# Patient Record
Sex: Female | Born: 1990 | Race: Black or African American | Hispanic: No | Marital: Single | Smoking: Current every day smoker
Health system: Southern US, Community
[De-identification: ages and names within clinical notes are randomized; demographics above are authoritative.]

## PROBLEM LIST (undated history)

## (undated) DIAGNOSIS — J45909 Unspecified asthma, uncomplicated: Secondary | ICD-10-CM

## (undated) DIAGNOSIS — I10 Essential (primary) hypertension: Secondary | ICD-10-CM

## (undated) DIAGNOSIS — L0231 Cutaneous abscess of buttock: Secondary | ICD-10-CM

## (undated) HISTORY — PX: NO PAST SURGERIES: SHX2092

## (undated) HISTORY — DX: Cutaneous abscess of buttock: L02.31

---

## 2008-06-13 ENCOUNTER — Emergency Department: Payer: Self-pay

## 2008-06-19 ENCOUNTER — Emergency Department: Payer: Self-pay | Admitting: Unknown Physician Specialty

## 2009-04-03 ENCOUNTER — Emergency Department: Payer: Self-pay | Admitting: Internal Medicine

## 2009-08-01 ENCOUNTER — Observation Stay: Payer: Self-pay

## 2009-08-07 ENCOUNTER — Inpatient Hospital Stay: Payer: Self-pay | Admitting: Obstetrics & Gynecology

## 2010-04-25 ENCOUNTER — Ambulatory Visit: Payer: Self-pay | Admitting: Internal Medicine

## 2011-09-30 LAB — URINALYSIS, COMPLETE
Bilirubin,UR: NEGATIVE
Blood: NEGATIVE
Glucose,UR: NEGATIVE mg/dL
Ketone: NEGATIVE
Leukocyte Esterase: NEGATIVE
Nitrite: NEGATIVE
Ph: 6
Protein: NEGATIVE
RBC,UR: 2 /HPF
Specific Gravity: 1.028
Squamous Epithelial: 5
WBC UR: 1 /HPF

## 2011-09-30 LAB — COMPREHENSIVE METABOLIC PANEL
Albumin: 3.7 g/dL (ref 3.4–5.0)
Alkaline Phosphatase: 48 U/L — ABNORMAL LOW (ref 50–136)
Anion Gap: 12 (ref 7–16)
BUN: 12 mg/dL (ref 7–18)
Bilirubin,Total: 0.2 mg/dL (ref 0.2–1.0)
Calcium, Total: 8.5 mg/dL (ref 8.5–10.1)
Chloride: 107 mmol/L (ref 98–107)
Co2: 24 mmol/L (ref 21–32)
Creatinine: 0.59 mg/dL — ABNORMAL LOW (ref 0.60–1.30)
EGFR (African American): >60
EGFR (Non-African Amer.): >60
Glucose: 134 mg/dL — ABNORMAL HIGH (ref 65–99)
Osmolality: 287 (ref 275–301)
Potassium: 4 mmol/L (ref 3.5–5.1)
SGOT(AST): 23 U/L (ref 15–37)
SGPT (ALT): 24 U/L
Sodium: 143 mmol/L (ref 136–145)
Total Protein: 7.5 g/dL (ref 6.4–8.2)

## 2011-09-30 LAB — CBC
HCT: 39.3 % (ref 35.0–47.0)
HGB: 13.1 g/dL (ref 12.0–16.0)
MCH: 31 pg (ref 26.0–34.0)
MCHC: 33.3 g/dL (ref 32.0–36.0)
MCV: 93 fL (ref 80–100)
Platelet: 280 10*3/uL (ref 150–440)
RBC: 4.22 10*6/uL (ref 3.80–5.20)
RDW: 14.4 % (ref 11.5–14.5)
WBC: 13.6 10*3/uL — ABNORMAL HIGH (ref 3.6–11.0)

## 2011-09-30 LAB — PREGNANCY, URINE: Pregnancy Test, Urine: NEGATIVE m[IU]/mL

## 2011-10-01 ENCOUNTER — Inpatient Hospital Stay: Payer: Self-pay | Admitting: Surgery

## 2011-10-01 LAB — CBC WITH DIFFERENTIAL/PLATELET
Basophil #: 0 10*3/uL (ref 0.0–0.1)
Basophil %: 0.2 %
Eosinophil %: 0.6 %
HCT: 28.1 % — ABNORMAL LOW (ref 35.0–47.0)
HGB: 9.8 g/dL — ABNORMAL LOW (ref 12.0–16.0)
Lymphocyte %: 14 %
MCHC: 34.7 g/dL (ref 32.0–36.0)
MCV: 93 fL (ref 80–100)
Monocyte %: 5.3 %
Neutrophil %: 79.9 %
RBC: 3.04 10*6/uL — ABNORMAL LOW (ref 3.80–5.20)
RDW: 13.6 % (ref 11.5–14.5)

## 2011-10-01 LAB — HEMATOCRIT: HCT: 30.9 % — ABNORMAL LOW (ref 35.0–47.0)

## 2011-10-01 LAB — HEMOGLOBIN: HGB: 10.4 g/dL — ABNORMAL LOW (ref 12.0–16.0)

## 2011-10-02 LAB — CBC WITH DIFFERENTIAL/PLATELET
Basophil #: 0 10*3/uL (ref 0.0–0.1)
Basophil %: 0.3 %
Eosinophil #: 0.2 10*3/uL (ref 0.0–0.7)
Eosinophil %: 2.1 %
HCT: 27.1 % — ABNORMAL LOW (ref 35.0–47.0)
HGB: 9 g/dL — ABNORMAL LOW (ref 12.0–16.0)
Lymphocyte %: 24.9 %
MCH: 30.7 pg (ref 26.0–34.0)
MCHC: 33.1 g/dL (ref 32.0–36.0)
Monocyte #: 0.7 10*3/uL (ref 0.0–0.7)
Neutrophil #: 6.2 10*3/uL (ref 1.4–6.5)
Neutrophil %: 65.4 %
RDW: 14.4 % (ref 11.5–14.5)

## 2012-11-10 ENCOUNTER — Encounter: Payer: Self-pay | Admitting: Obstetrics & Gynecology

## 2012-11-22 ENCOUNTER — Encounter: Payer: Self-pay | Admitting: Pediatrics

## 2012-11-22 DIAGNOSIS — I10 Essential (primary) hypertension: Secondary | ICD-10-CM | POA: Insufficient documentation

## 2012-12-08 ENCOUNTER — Encounter: Payer: Self-pay | Admitting: Maternal & Fetal Medicine

## 2012-12-29 ENCOUNTER — Encounter: Payer: Self-pay | Admitting: Maternal and Fetal Medicine

## 2013-01-09 DIAGNOSIS — J45909 Unspecified asthma, uncomplicated: Secondary | ICD-10-CM | POA: Insufficient documentation

## 2013-01-09 DIAGNOSIS — Z98891 History of uterine scar from previous surgery: Secondary | ICD-10-CM | POA: Insufficient documentation

## 2013-01-17 DIAGNOSIS — O411290 Chorioamnionitis, unspecified trimester, not applicable or unspecified: Secondary | ICD-10-CM | POA: Insufficient documentation

## 2013-02-09 ENCOUNTER — Emergency Department: Payer: Self-pay | Admitting: Emergency Medicine

## 2013-08-27 ENCOUNTER — Emergency Department: Payer: Self-pay | Admitting: Emergency Medicine

## 2013-09-12 ENCOUNTER — Emergency Department: Payer: Self-pay | Admitting: Emergency Medicine

## 2014-01-02 ENCOUNTER — Emergency Department: Payer: Self-pay | Admitting: Emergency Medicine

## 2014-01-25 ENCOUNTER — Emergency Department: Payer: Self-pay | Admitting: Emergency Medicine

## 2014-02-26 ENCOUNTER — Emergency Department: Payer: Self-pay | Admitting: Emergency Medicine

## 2014-10-15 IMAGING — US US OB DETAIL+14 WK - NRPT [facility]
1 series · 14 of 28 positions shown · non-contrast
Comparison: none

[Series 1: us ob detail+14 wk - nrpt [facility] · 0.25mm/px · 14 of 87 slices shown]
[im 4/87]
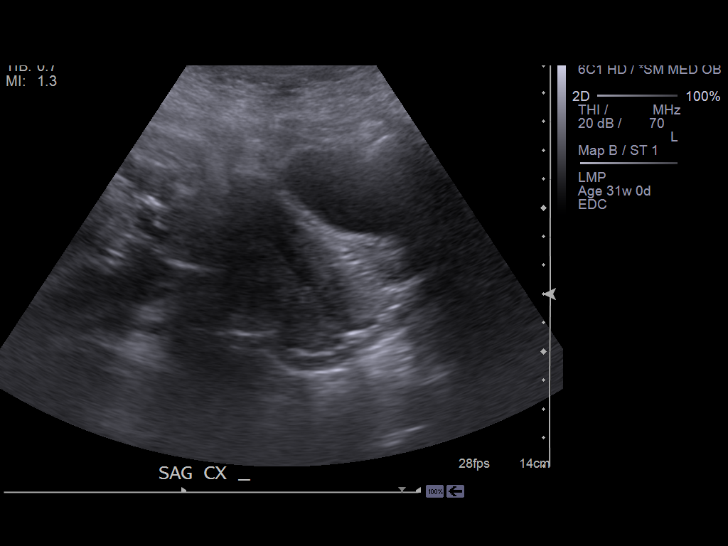
[im 10/87]
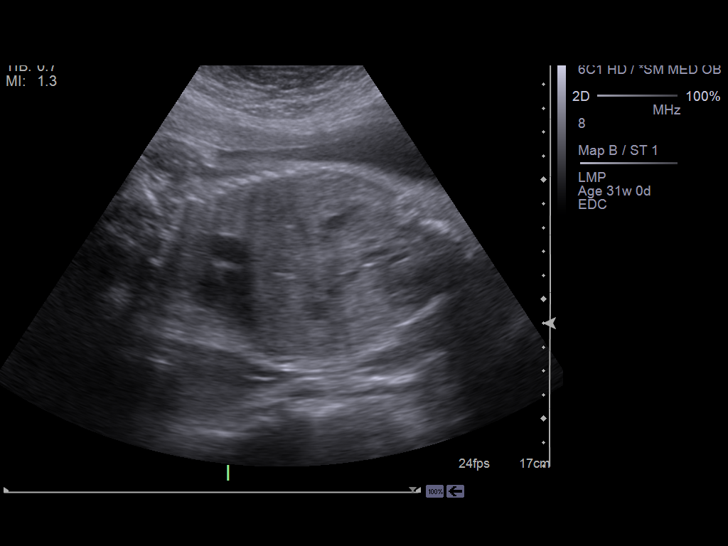
[im 16/87]
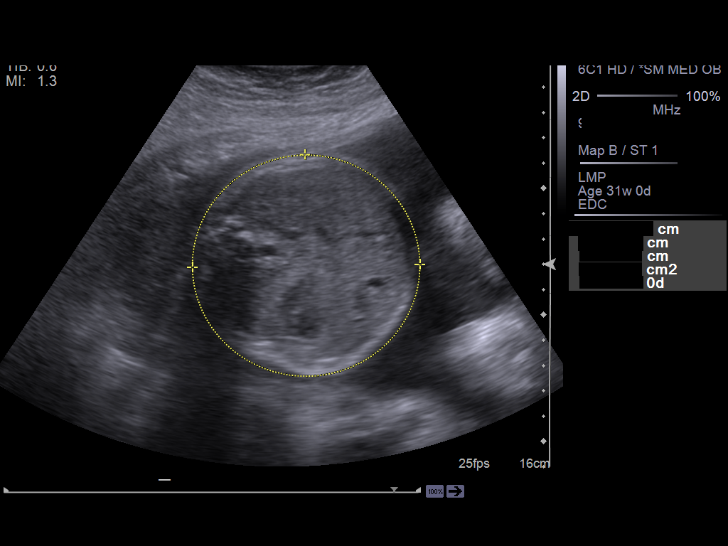
[im 23/87]
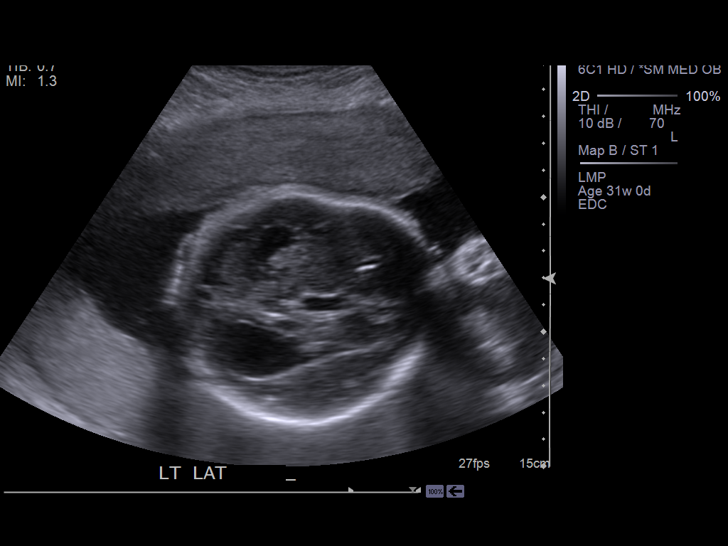
[im 29/87]
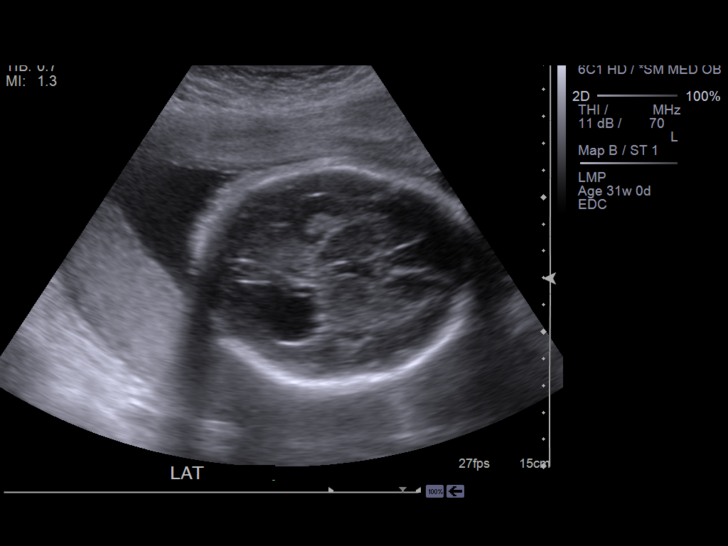
[im 36/87]
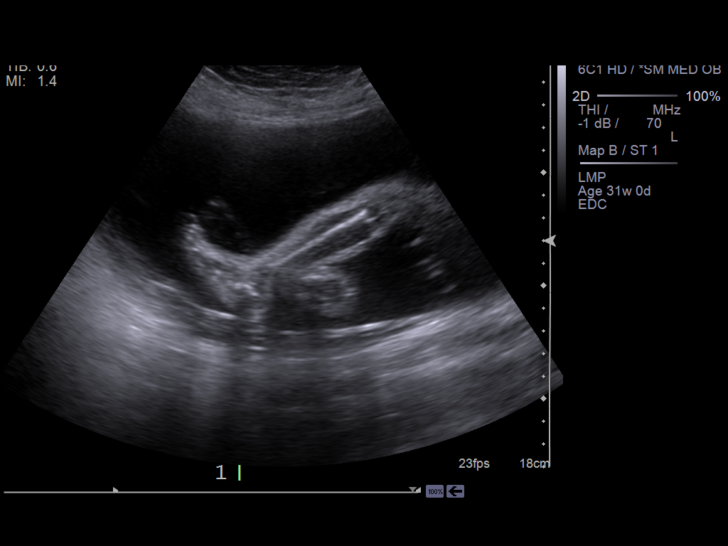
[im 42/87]
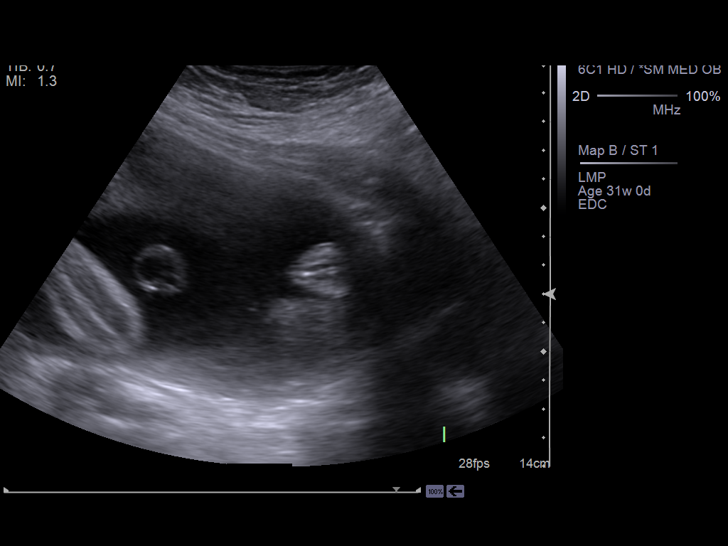
[im 48/87]
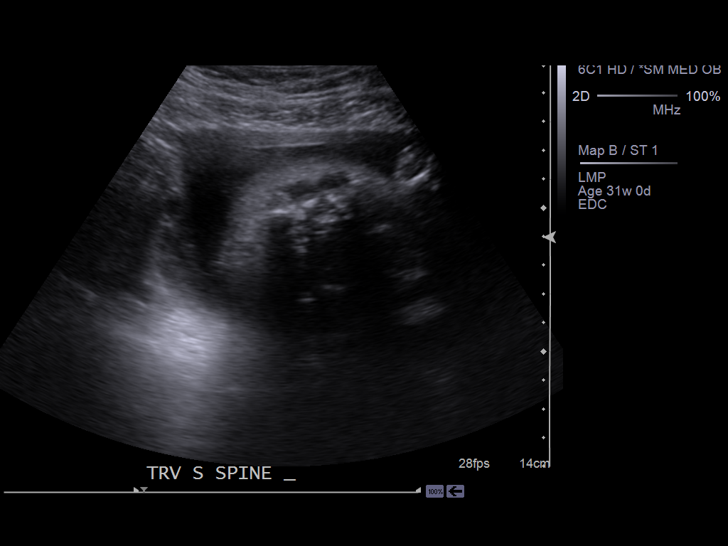
[im 55/87]
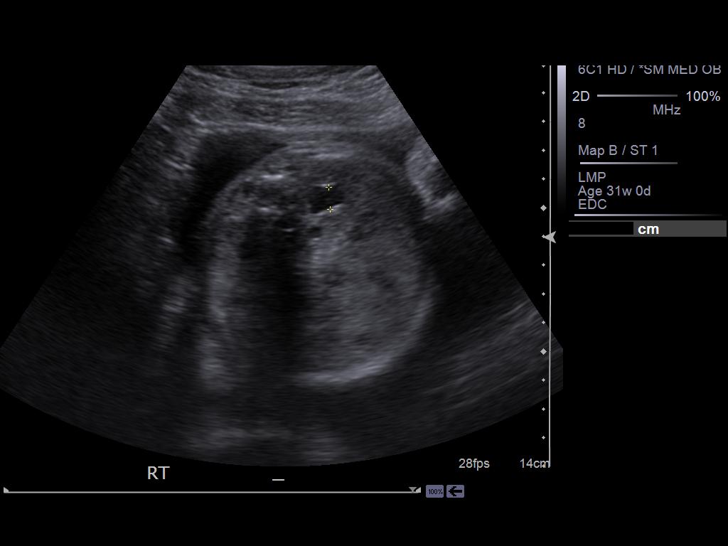
[im 61/87]
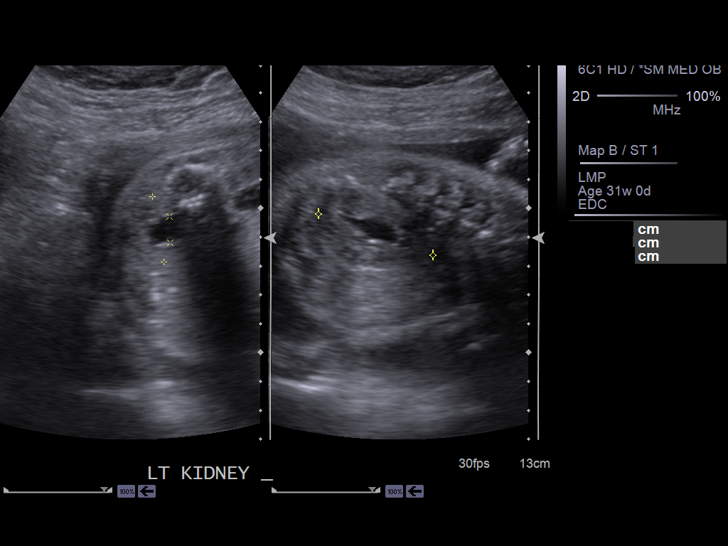
[im 67/87]
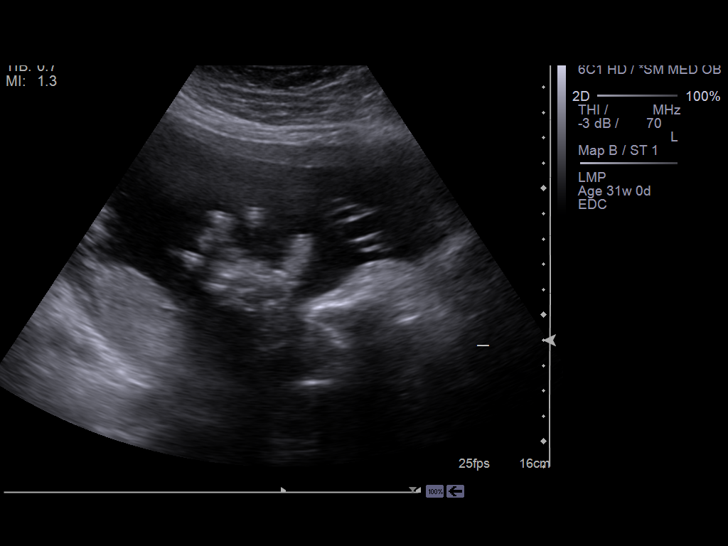
[im 74/87]
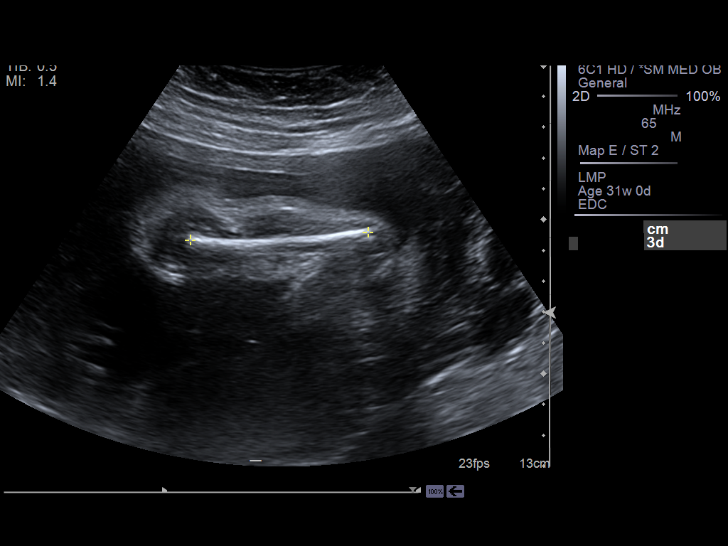
[im 80/87]
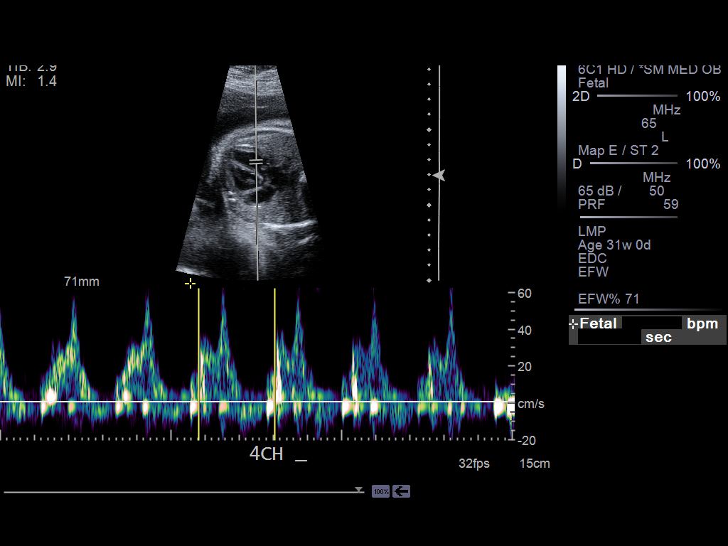
[im 87/87]
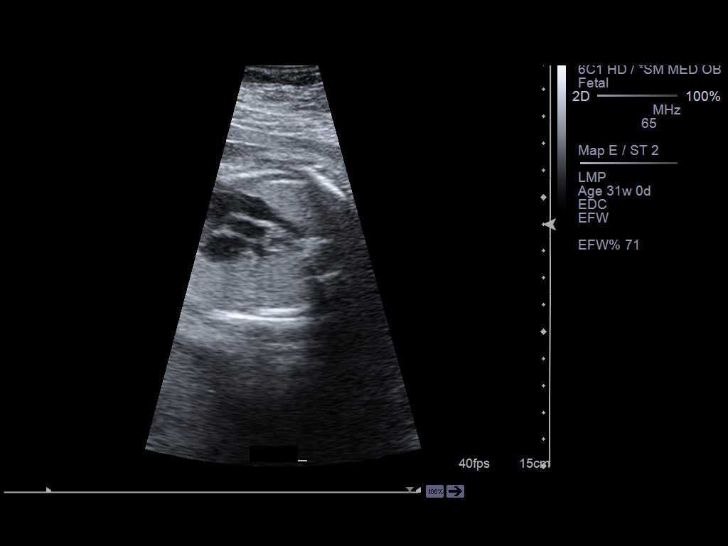

[14 of 28 positions shown; findings below may reference images not displayed]

IMAGES IMPORTED FROM THE [employer] WORKFLOW SYSTEM
NO DICTATION FOR STUDY

## 2014-11-12 IMAGING — US US OB FOLLOW-UP - [facility]PT [facility]
1 series · 14 of 28 positions shown · non-contrast
Comparison: none

[Series 1: us ob follow-up - nrpt [facility] · 0.33mm/px · 14 of 64 slices shown]
[im 3/64]
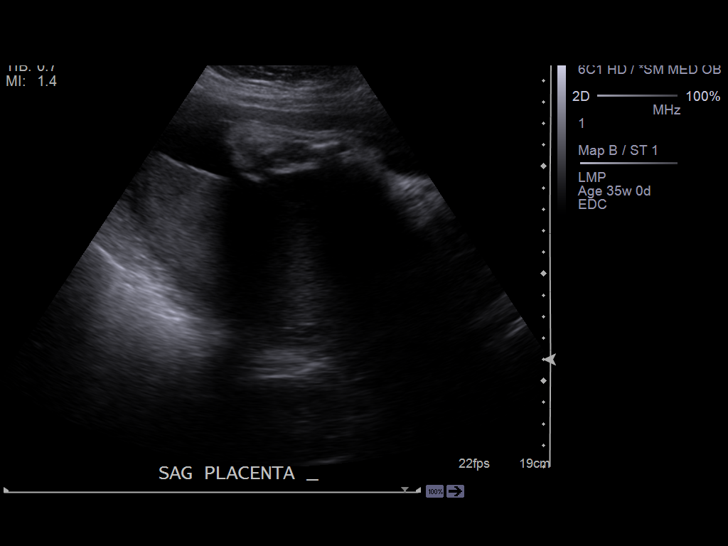
[im 8/64]
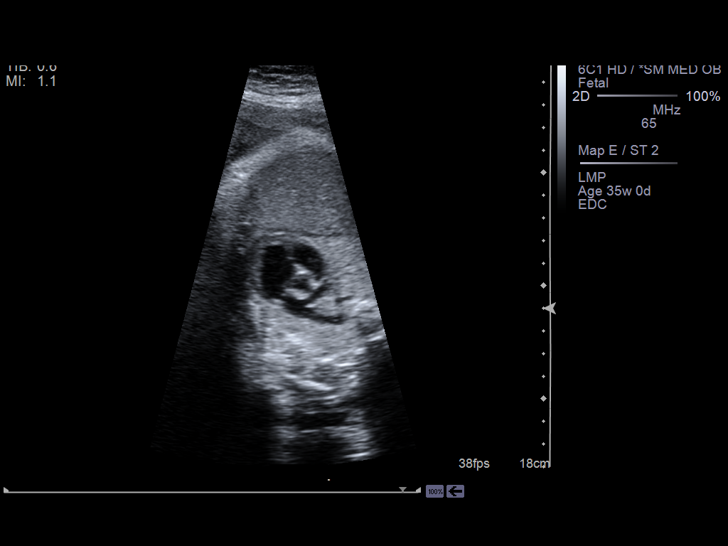
[im 12/64]
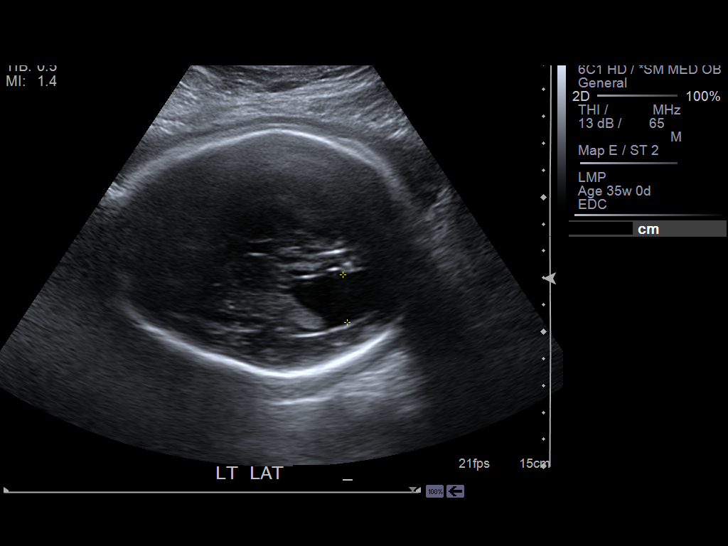
[im 17/64]
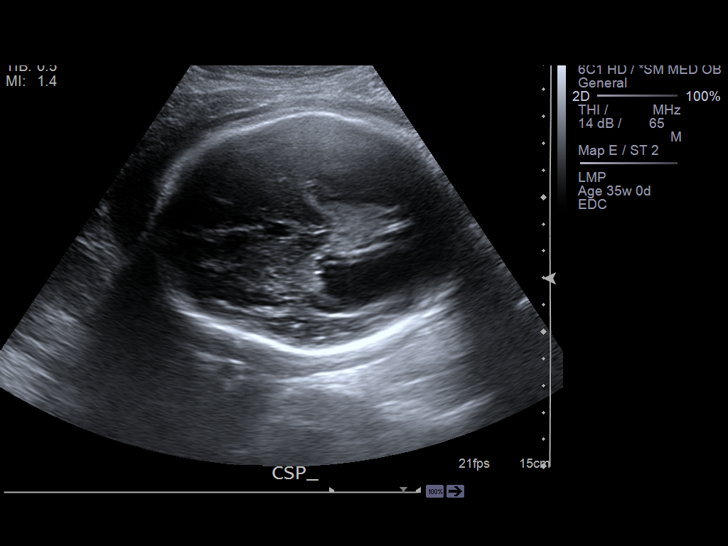
[im 22/64]
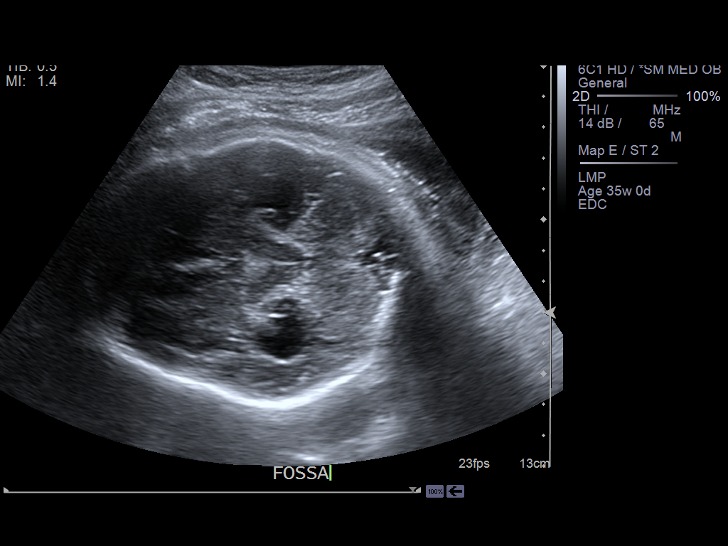
[im 26/64]
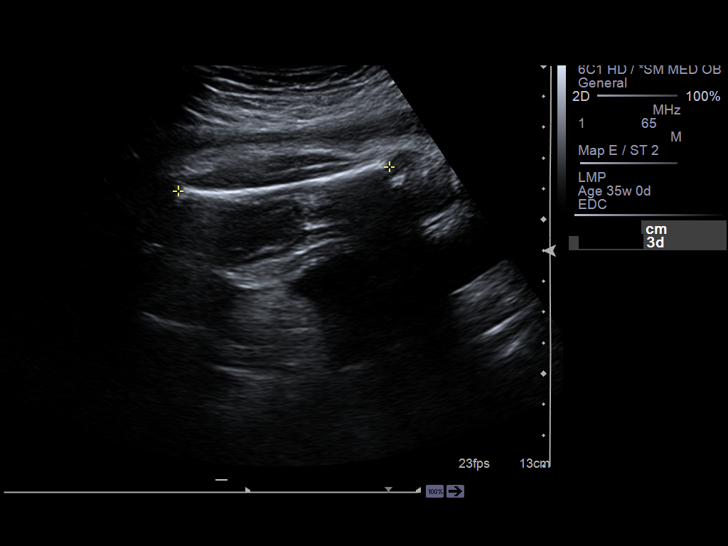
[im 31/64]
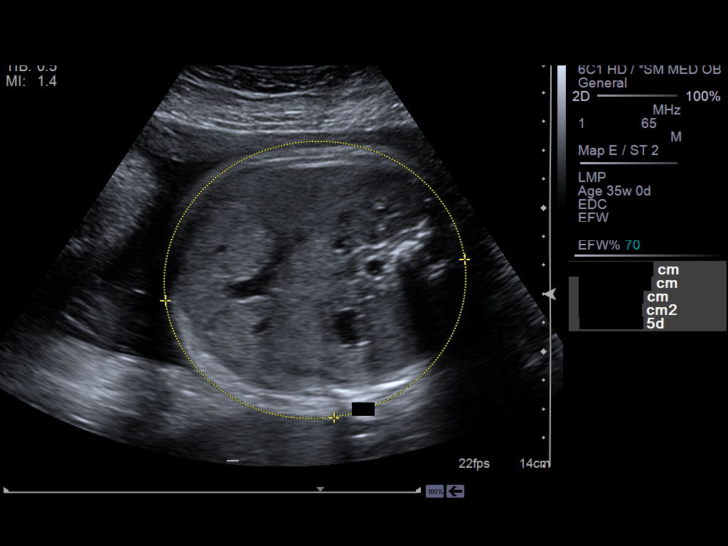
[im 36/64]
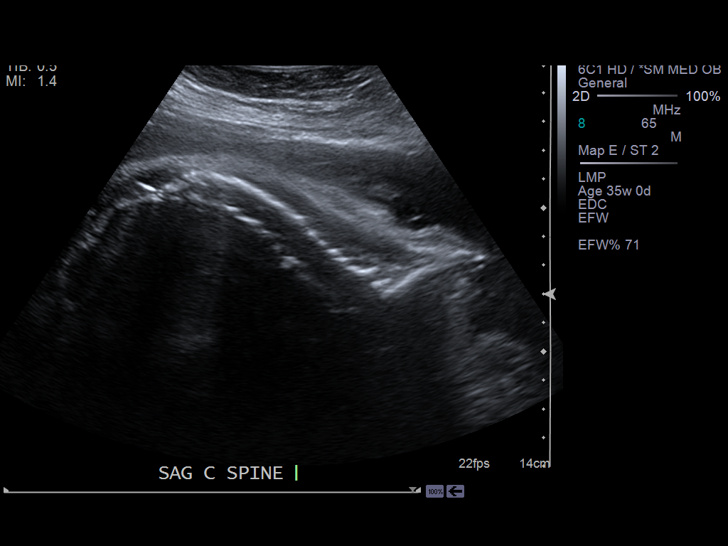
[im 40/64]
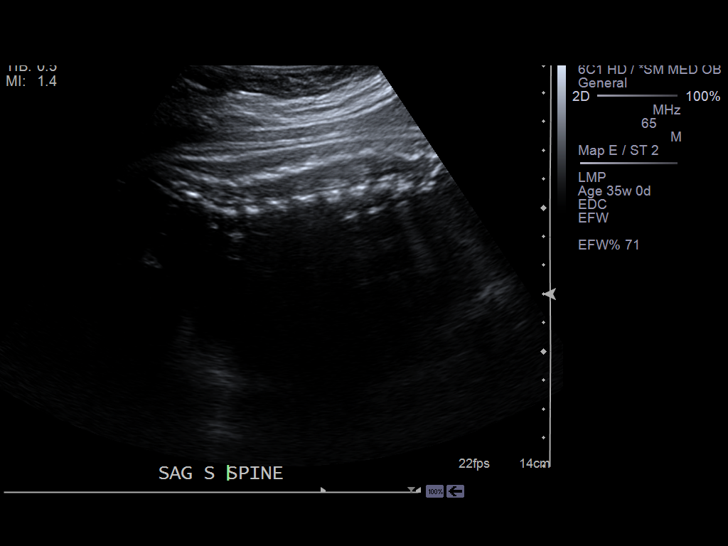
[im 45/64]
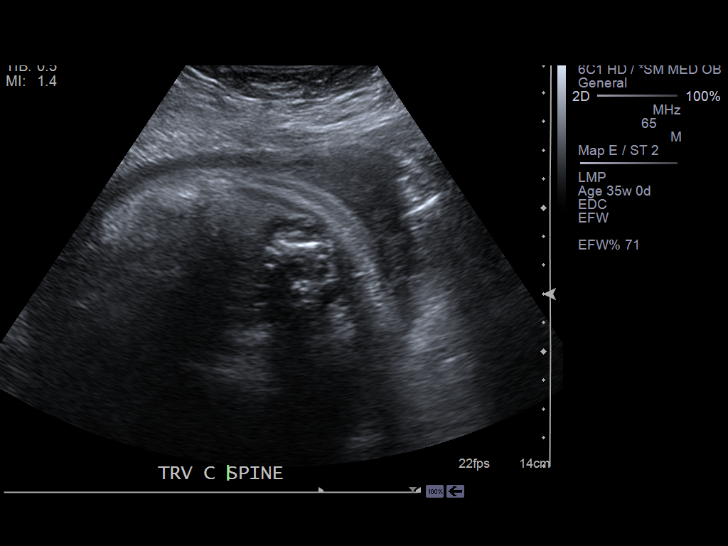
[im 50/64]
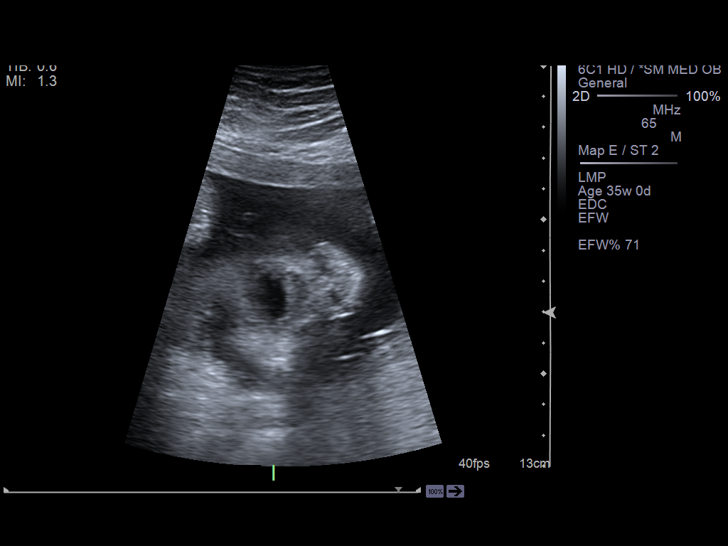
[im 54/64]
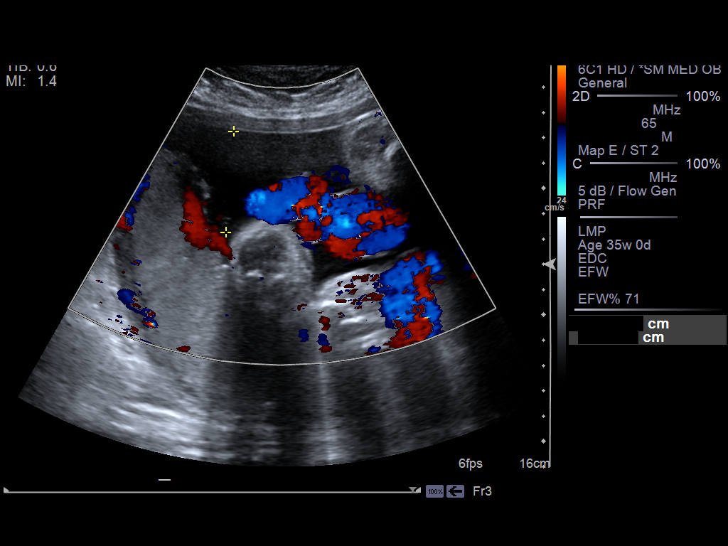
[im 59/64]
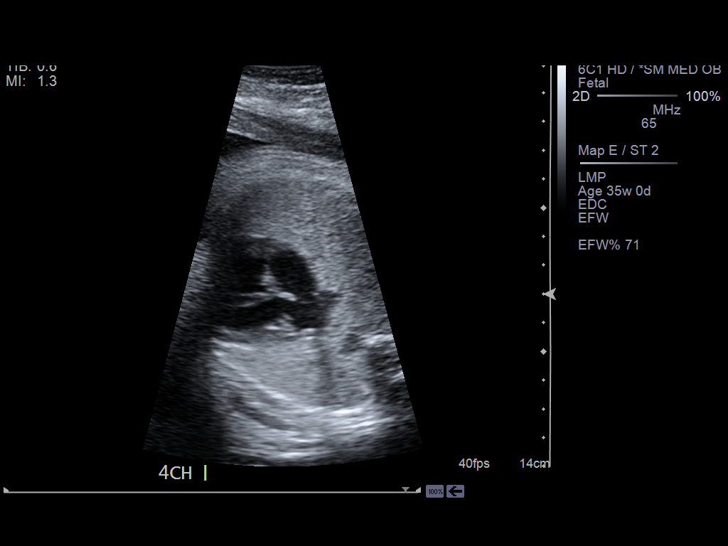
[im 64/64]
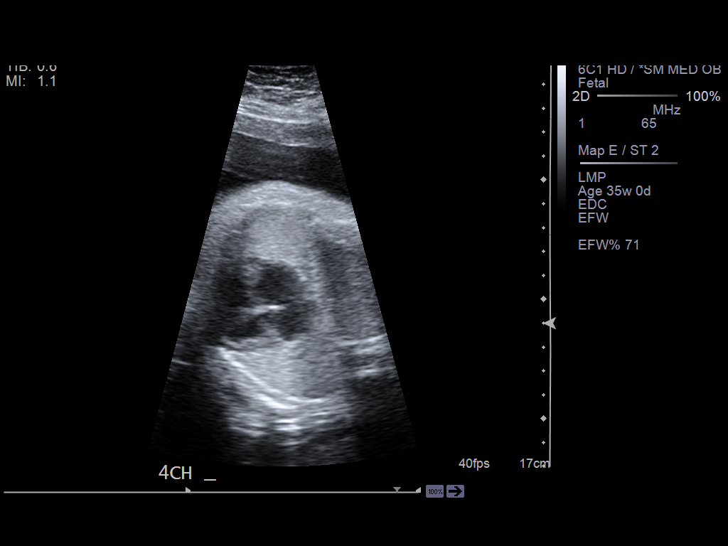

[14 of 28 positions shown; findings below may reference images not displayed]

IMAGES IMPORTED FROM THE [employer] WORKFLOW SYSTEM
NO DICTATION FOR STUDY

## 2014-12-03 IMAGING — US US OB FOLLOW-UP - [facility]PT [facility]
1 series · 14 of 28 positions shown · non-contrast
Comparison: none

[Series 1: us ob follow-up - [facility] · 0.26mm/px · 14 of 86 slices shown]
[im 4/86]
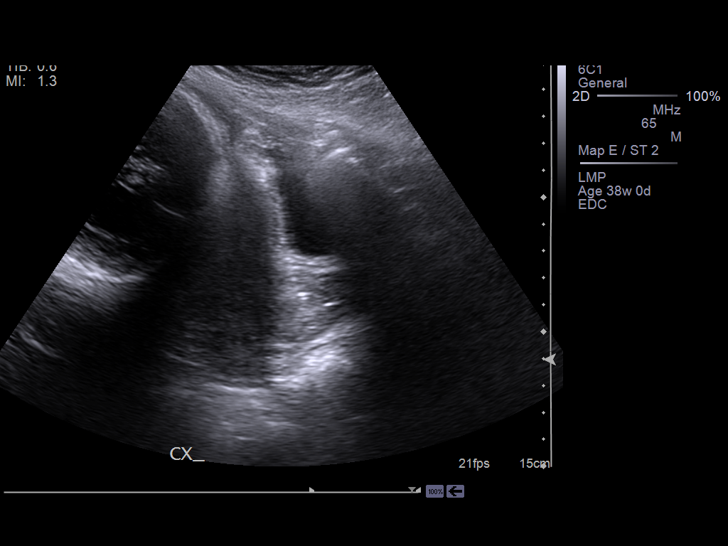
[im 10/86]
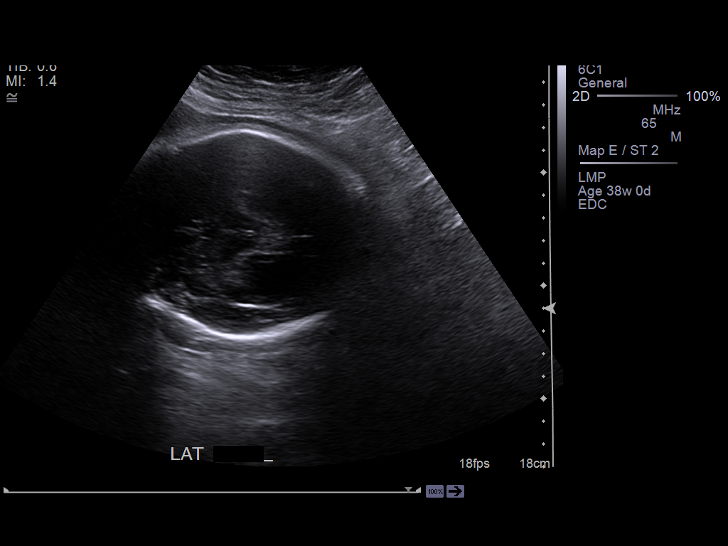
[im 16/86]
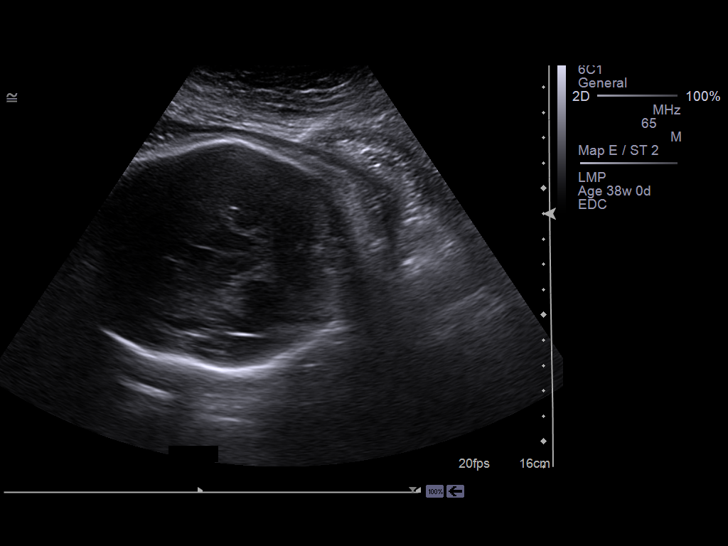
[im 23/86]
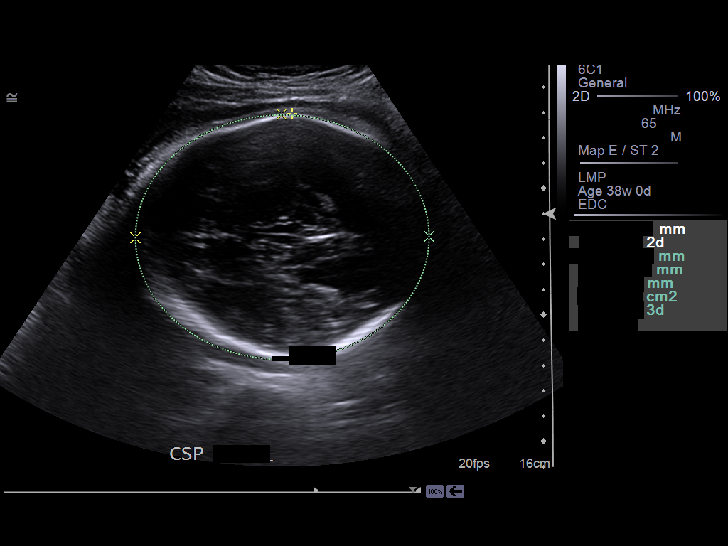
[im 29/86]
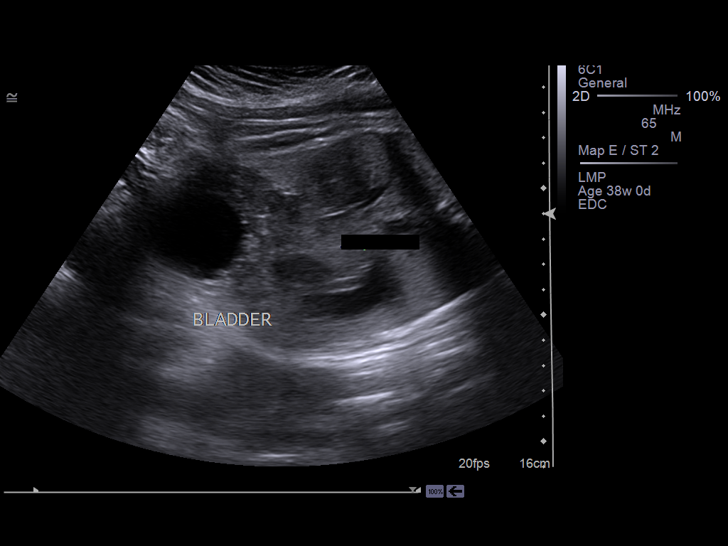
[im 35/86]
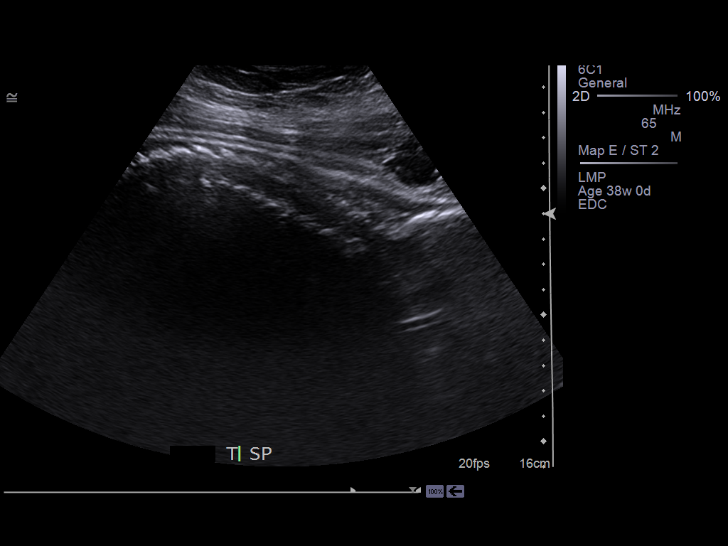
[im 41/86]
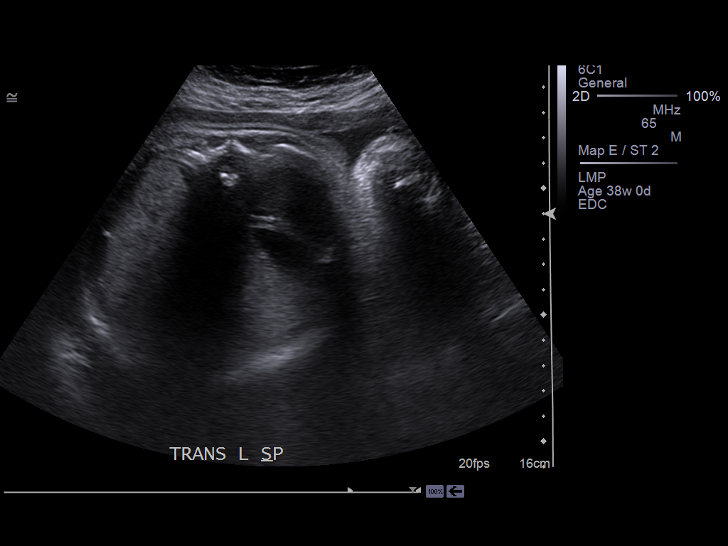
[im 48/86]
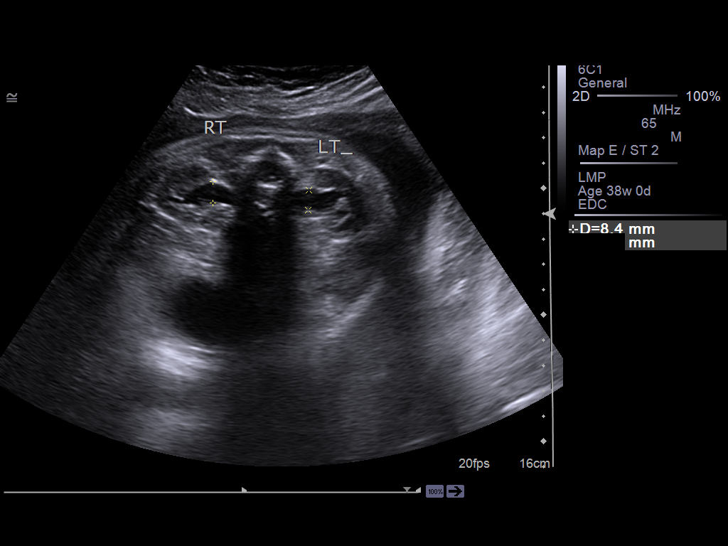
[im 54/86]
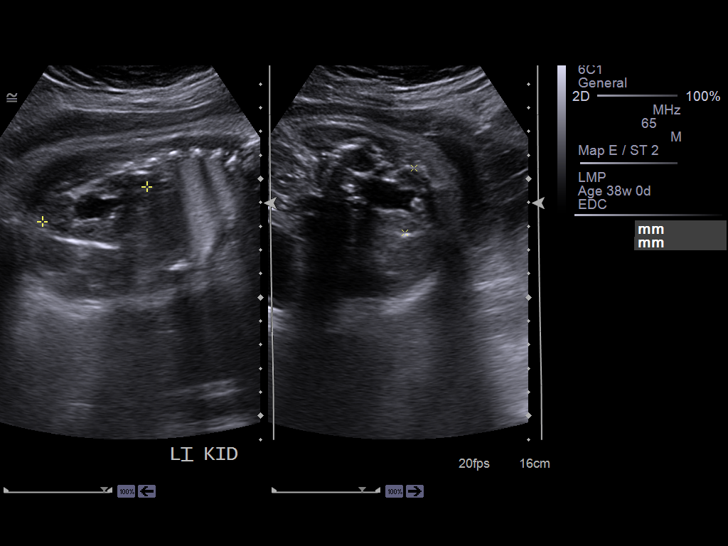
[im 60/86]
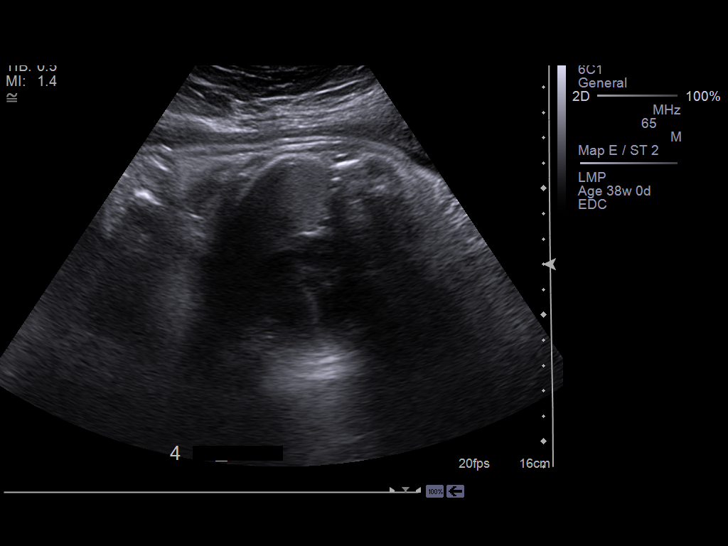
[im 67/86]
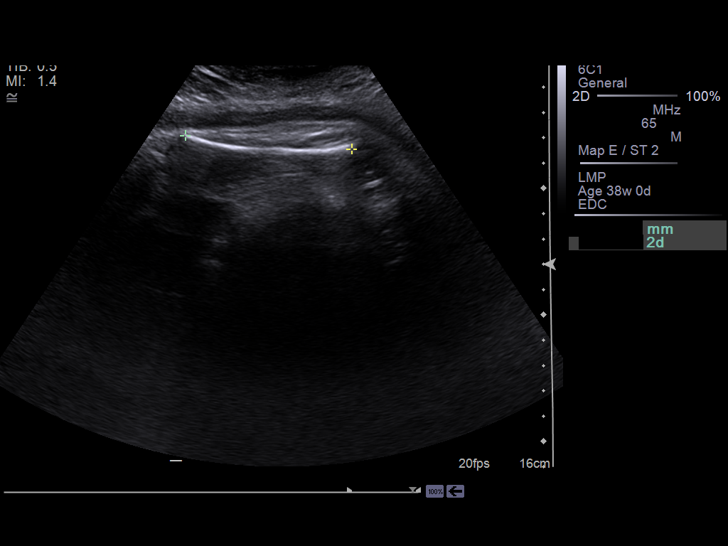
[im 73/86]
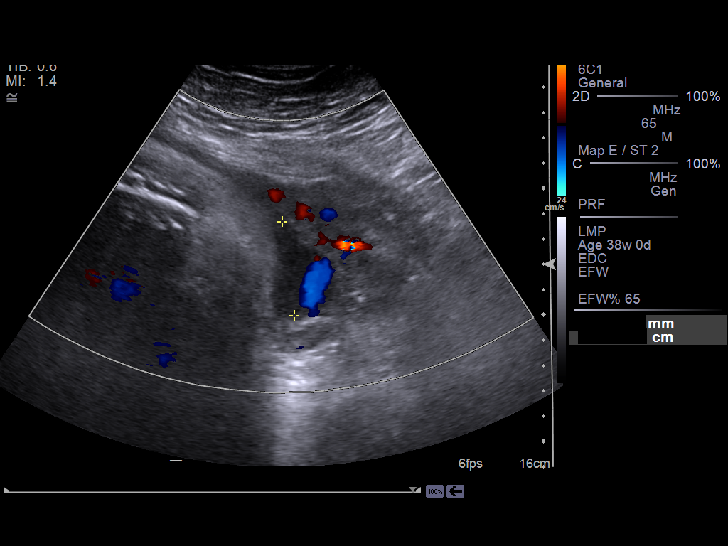
[im 79/86]
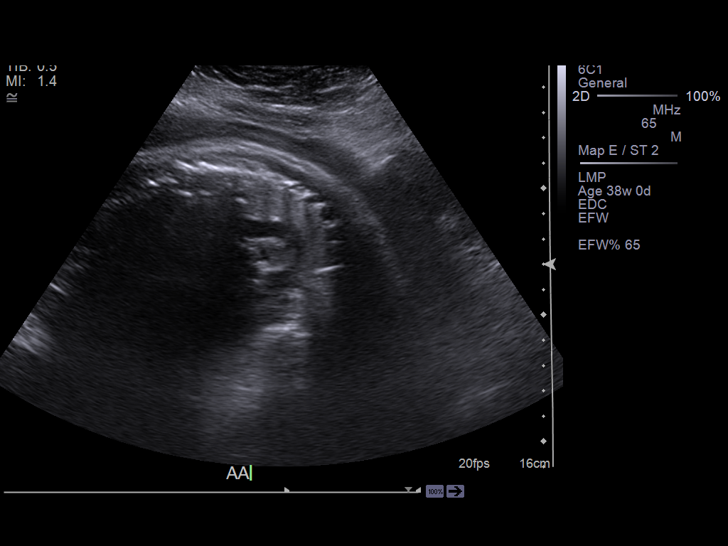
[im 86/86]
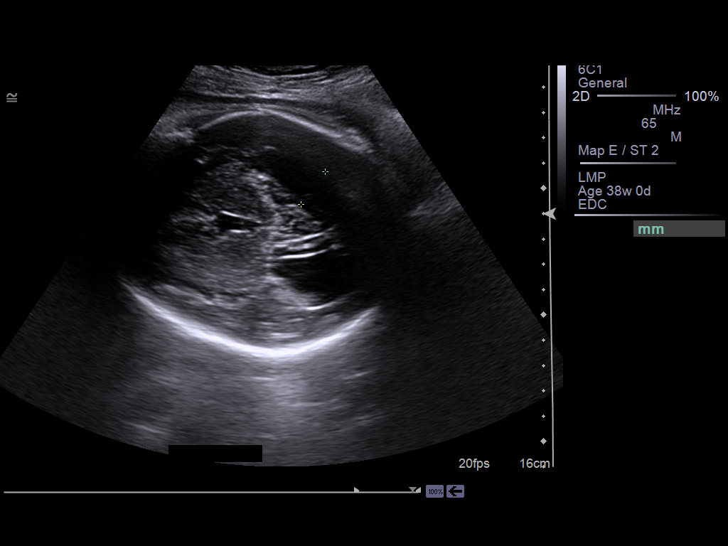

[14 of 28 positions shown; findings below may reference images not displayed]

IMAGES IMPORTED FROM THE [employer] WORKFLOW SYSTEM
NO DICTATION FOR STUDY

## 2014-12-30 NOTE — Op Note (Signed)
PATIENT NAME:  [facility], [facility] MR#:  [id number] DATE OF BIRTH:  1990/12/02  DATE OF PROCEDURE:  10/01/2011  PREOPERATIVE DIAGNOSIS: Acute appendicitis.   POSTOPERATIVE DIAGNOSES:  1. Hemoperitoneum.  2. Ruptured right ovarian cyst.   PROCEDURES: Diagnostic laparoscopy and laparoscopic evacuation of hemoperitoneum.   SURGEON: Amaury Kuzel E. Excell Seltzerooper, MD  ANESTHESIA: General with endotracheal tube by Dr. Maisie Fushomas.   INDICATIONS: This is a patient with right lower quadrant pain, leukocytosis and CT scan suggesting a ruptured appendix with fluid in the right side. Preoperatively we discussed rationale for surgery, the options of observation, risk of bleeding, infection, recurrence, negative laparoscopy, and an open procedure. This was all reviewed for she and her family. They understood and agreed to proceed.   FINDINGS: Hemoperitoneum arising from a ruptured right ovarian hemorrhagic cyst, mostly clotted blood. Approximately 850 mL of mostly clotted blood was evacuated. The appendix was identified and normal, noninflamed and was not removed.   The hemorrhagic cyst was not bleeding and most of the blood was aspirated and irrigated clear.   DESCRIPTION OF PROCEDURE: Patient was induced to general anesthesia, given IV antibiotics. VTE prophylaxis was in place. A Foley catheter was placed and she was prepped and draped in sterile fashion. Marcaine was infiltrated in skin and subcutaneous tissues around the periumbilical area. Incision was made. Veress needle was placed. Pneumoperitoneum was obtained and a 5 mm trocar port was placed. The abdominal cavity was explored and upon placement of the camera immediately a large amount of blood was identified in and around the pelvis and right lower quadrant. Most of this was clotted blood.   A suprapubic 5 mm port was placed under direct vision and ultimately a left lateral 5 mm port was placed as well under direct vision to aid in irrigation and aspiration and  evaluation of the ovarian cyst.   Extensive suctioning was performed to remove clotted and nonclotted blood from the right lower quadrant and from the pelvis. The uterus was found to be normal. There was a very large right ovarian cyst which was hemorrhagic. The posterior side of the hemorrhagic cyst was ruptured and open with a large amount of clot. There was no active bleeding noted. Approximately 850 mL of clotted and nonclotted blood was removed. There was no sign of further bleeding.   The appendix was identified somewhat retrocecal on the right side but not beneath the peritoneal reflection. It was small and noninflamed and was left in situ.   The left ovary appeared slightly enlarged but otherwise normal. The tubes were normal and noninflamed and the uterus appeared noninflamed. There was no sign of purulence.   Irrigation was performed and the area was inspected multiple times. There was no sign of active bleeding therefore pneumoperitoneum was released. All ports were removed and 4-0 subcuticular Monocryl was used at all skin edges. Steri-Strips, Mastisol, and sterile dressings were placed.   Patient tolerated the procedure well. There were no complications. She was taken to the recovery room in stable condition to be admitted for continued care.    ____________________________ Adah Salvageichard E. Excell Seltzerooper, MD rec:cms [facility]: 10/01/2011 02:07:16 ET T: 10/01/2011 10:49:44 ET JOB#: [id number]  cc: Adah Salvageichard E. Excell Seltzerooper, MD, <Dictator> Lattie HawICHARD E Mekiah Wahler MD ELECTRONICALLY SIGNED 10/02/2011 0:40

## 2014-12-30 NOTE — H&P (Signed)
PATIENT NAME:  [facility], Julie Reid MR#:  [id number] DATE OF BIRTH:  [birth year]  DATE OF ADMISSION:  10/01/2011  CHIEF COMPLAINT: Right lower quadrant pain.   HISTORY OF PRESENT ILLNESS: This is a patient with one day of abdominal pain. She states that it started at 2:00 this afternoon and had not had any problems prior to that. However, on further questioning she states that she had a "virus" approximately 10 days ago in which she had severe nausea, multiple emeses and diarrhea with minimal abdominal pain. She has had low-grade fevers, has had some dysuria but states that is more pressure when she urinates. She has had some back pain and she states she has never had an episode like this before.   Her CT is highly suggestive of ruptured appendix and coupled with this history of being ill 10 days ago it seems to fit.   PAST MEDICAL HISTORY: Asthma and hypertension. She uses inhaler but does not take medicines for her hypertension.   PAST SURGICAL HISTORY: She has had her tonsils removed as well as a Cesarean section.   ALLERGIES: None.   MEDICATIONS: Inhaler.   FAMILY HISTORY: Noncontributory.   SOCIAL HISTORY: She works in a [employer]. Smokes tobacco. Does not drink alcohol.   REVIEW OF SYSTEMS: 10 system review has been performed and negative with the exception of that mentioned in the history of present illness.   PHYSICAL EXAMINATION:  GENERAL: Morbidly obese black female patient. BMI is 38, 62 inches tall, weight of 207.   HEENT: No scleral icterus.   NECK: No palpable neck nodes.   CHEST: Clear to auscultation.   CARDIAC: Regular rate and rhythm.   ABDOMEN: Obese, fairly soft. There is some guarding in the right lower quadrant and right side. There is some percussion and rebound tenderness and a scar.   EXTREMITIES: Without edema.   NEUROLOGIC: Grossly intact.   INTEGUMENT: No jaundice.   LABORATORY, DIAGNOSTIC AND RADIOLOGICAL DATA: Laboratory values demonstrate a  clean urinalysis. A negative hCG. Normal electrolytes and a white blood cell count of 13.6, hemoglobin and hematocrit of 13 and 39.   CT scan is personally reviewed and Dr. Manson Passey has spoken to Dr. Allena Katz personally. Dr. Allena Katz states he does not see the appendix but there is free fluid in the right lower quadrant and right side suggestive of ascites. I have reviewed those films myself and I feel that this likely fits a ruptured appendix.   ASSESSMENT AND PLAN: This is a patient with CT findings of fluid in the right lower quadrant and right side, had a history of abdominal pain, nausea, vomiting and diarrhea 10 days ago who presents now with one day of abdominal pain. I believe she probably ruptured her appendix 7 to 10 days ago after the illness she considered a "virus". My belief is that this is likely a ruptured appendix. I discussed with she and her family the rationale for offering diagnostic laparoscopy and an attempt at an appendectomy. In fact sometimes the appendix cannot be identified when it has been autolyzed or present in an abscess cavity for so long and that there is a potential for an open procedure, also the risks of not being able to remove the appendix and drainage, etc. This was all reviewed for her. I also discussed the risks of bleeding, infection, recurrent abscess which is a strong likelihood in this situation. She understood and agreed to proceed. ____________________________ Adah Salvage Excell Seltzer, MD rec:cms D: 10/01/2011 00:36:56 ET T:  10/01/2011 07:24:05 ET  JOB#: [id number] Lattie HawICHARD E Clair Alfieri MD ELECTRONICALLY SIGNED 10/02/2011 0:40

## 2014-12-30 NOTE — H&P (Signed)
Subjective/Chief Complaint rlq pain    History of Present Illness One day of pain but had "virus" 10 d ago with nausea, mult emesis and diarrhea. No prior episode. Low grade fevers.  some back pain    Past History PMH  asthma, HTN no meds PSH T&A, C section    Past Medical Health Hypertension, Smoking   Past Med/Surgical Hx:  HTN:   ALLERGIES:  No Known Allergies:   Family and Social History:   Family History Non-Contributory    Social History positive  tobacco, negative ETOH, tob factory    + Tobacco Current (within 1 year)    Place of Living Home   Review of Systems:   Fever/Chills Yes    Cough No    Abdominal Pain Yes    Diarrhea Yes    Constipation No    Nausea/Vomiting Yes    SOB/DOE No    Chest Pain No    Dysuria No    Tolerating Diet Yes   Physical Exam:   GEN NAD, obese    HEENT pink conjunctivae    NECK supple    RESP normal resp effort  clear BS    CARD regular rate    ABD positive tenderness  soft  guarding, rebound, perc tenderness    SKIN normal to palpation, tattoos    PSYCH alert, A+O to time, place, person, good insight   Routine Hem:  23-Jan-13 19:01    WBC (CBC) 13.6   RBC (CBC) 4.22   Hemoglobin (CBC) 13.1   Hematocrit (CBC) 39.3   Platelet Count (CBC) 280   MCV 93   MCH 31.0   MCHC 33.3   RDW 14.4  Routine Chem:  23-Jan-13 19:01    Glucose, Serum 134   BUN 12   Creatinine (comp) 0.59   Sodium, Serum 143   Potassium, Serum 4.0   Chloride, Serum 107   CO2, Serum 24   Calcium (Total), Serum 8.5  Hepatic:  23-Jan-13 19:01    Bilirubin, Total 0.2   Alkaline Phosphatase 48   SGPT (ALT) 24   SGOT (AST) 23   Total Protein, Serum 7.5   Albumin, Serum 3.7  Routine Chem:  23-Jan-13 19:01    Osmolality (calc) 287   eGFR (African American) >60   eGFR (Non-African American) >60   Anion Gap 12  Routine Sero:  23-Jan-13 19:01    Pregnancy Test, Urine NEGATIVE  Routine UA:  23-Jan-13 19:01    Color (UA)  Yellow   Clarity (UA) Hazy   Glucose (UA) Negative   Bilirubin (UA) Negative   Ketones (UA) Negative   Specific Gravity (UA) 1.028   Blood (UA) Negative   pH (UA) 6.0   Protein (UA) Negative   Nitrite (UA) Negative   Leukocyte Esterase (UA) Negative   RBC (UA) 2 /HPF   WBC (UA) 1 /HPF   Bacteria (UA) TRACE   Epithelial Cells (UA) 5 /HPF   Mucous (UA) PRESENT   Radiology Results: CT:    23-Jan-13 23:36, CT Abdomen and Pelvis With Contrast   CT Abdomen and Pelvis With Contrast   REASON FOR EXAM:      COMMENTS:       PROCEDURE: CT  - CT ABDOMEN / PELVIS  W  - Sep 30 2011 11:36PM     RESULT:  Comparison:  None    Technique: Multiple axial images of the abdomen and pelvis were performed   from the lung bases to the pubic  symphysis, without p.o. contrast and   with 100 ml of Isovue 370 intravenous contrast.    Findings:    The lung bases are clear. There is no pneumothorax. The heart size is   normal.  The liver demonstrates no focal abnormality. There is no intrahepatic or   extrahepatic biliary ductal dilatation. The gallbladder is unremarkable.   The spleen demonstrates no focal abnormality. The kidneys, adrenal   glands, and pancreas are normal. The bladder is unremarkable. There is a   moderate amount of abdominal ascites.    The stomach, duodenum, small intestine, and large intestine demonstrate   no abnormality, but evaluation is limited secondary to lack of enteric   contrast.  There is no pneumoperitoneum, pneumatosis, or portal venous   gas. There is no abdominal or pelvic free fluid. There is no   lymphadenopathy.    The abdominal aorta is normal in caliber .    The osseous structures are unremarkable.  IMPRESSION:      Moderate amount of abdominal ascites.      Addendum: No normal nor abnormal appendix is visualized. The lack of   enteric contrast limits evaluation. If there is concern regarding   appendicitis given the moderate amount of ascites  recommend a repeat CT   of abdomen with oral contrast.          Verified By: Jennette Banker, M.D., MD     Assessment/Admission Diagnosis Rev'd Ct and Dr Owens Shark disc with Dr Posey Pronto. Appendix not seen but free fluid in Rt side, RLQ and illness 10 days ago suggests ruptured appendix rec laparoscopy risks options in detail see dictation agrees with plan   Electronic Signatures: Florene Glen (MD)  (Signed 24-Jan-13 00:32)  Authored: CHIEF COMPLAINT and HISTORY, PAST MEDICAL/SURGIAL HISTORY, ALLERGIES, FAMILY AND SOCIAL HISTORY, REVIEW OF SYSTEMS, PHYSICAL EXAM, LABS, Radiology, ASSESSMENT AND PLAN   Last Updated: 24-Jan-13 00:32 by Florene Glen (MD)

## 2015-12-23 ENCOUNTER — Emergency Department
Admission: EM | Admit: 2015-12-23 | Discharge: 2015-12-23 | Disposition: A | Discharge status: Home / Self Care | Payer: BLUE CROSS/BLUE SHIELD | Attending: Emergency Medicine | Admitting: Emergency Medicine

## 2015-12-23 ENCOUNTER — Telehealth: Payer: Self-pay

## 2015-12-23 ENCOUNTER — Encounter: Payer: Self-pay | Admitting: Emergency Medicine

## 2015-12-23 ENCOUNTER — Other Ambulatory Visit: Payer: Self-pay

## 2015-12-23 DIAGNOSIS — I10 Essential (primary) hypertension: Secondary | ICD-10-CM | POA: Insufficient documentation

## 2015-12-23 DIAGNOSIS — L0231 Cutaneous abscess of buttock: Secondary | ICD-10-CM | POA: Diagnosis present

## 2015-12-23 DIAGNOSIS — F17200 Nicotine dependence, unspecified, uncomplicated: Secondary | ICD-10-CM | POA: Diagnosis not present

## 2015-12-23 DIAGNOSIS — L0501 Pilonidal cyst with abscess: Secondary | ICD-10-CM | POA: Diagnosis not present

## 2015-12-23 DIAGNOSIS — J45909 Unspecified asthma, uncomplicated: Secondary | ICD-10-CM | POA: Insufficient documentation

## 2015-12-23 HISTORY — DX: Unspecified asthma, uncomplicated: J45.909

## 2015-12-23 HISTORY — DX: Essential (primary) hypertension: I10

## 2015-12-23 LAB — CBC WITH DIFFERENTIAL/PLATELET
BASOS PCT: 0 %
Basophils Absolute: 0.1 10*3/uL (ref 0–0.1)
EOS ABS: 0.1 10*3/uL (ref 0–0.7)
Eosinophils Relative: 0 %
HEMATOCRIT: 39.2 % (ref 35.0–47.0)
HEMOGLOBIN: 13.2 g/dL (ref 12.0–16.0)
Lymphocytes Relative: 11 %
Lymphs Abs: 1.8 10*3/uL (ref 1.0–3.6)
MCH: 30.9 pg (ref 26.0–34.0)
MCHC: 33.7 g/dL (ref 32.0–36.0)
MCV: 91.8 fL (ref 80.0–100.0)
MONO ABS: 1.1 10*3/uL — AB (ref 0.2–0.9)
MONOS PCT: 7 %
Neutro Abs: 13 10*3/uL — ABNORMAL HIGH (ref 1.4–6.5)
Neutrophils Relative %: 82 %
Platelets: 262 10*3/uL (ref 150–440)
RBC: 4.27 MIL/uL (ref 3.80–5.20)
RDW: 13.7 % (ref 11.5–14.5)
WBC: 16 10*3/uL — ABNORMAL HIGH (ref 3.6–11.0)

## 2015-12-23 LAB — BASIC METABOLIC PANEL
Anion gap: 5 (ref 5–15)
BUN: 6 mg/dL (ref 6–20)
CALCIUM: 8.5 mg/dL — AB (ref 8.9–10.3)
CO2: 26 mmol/L (ref 22–32)
CREATININE: 0.56 mg/dL (ref 0.44–1.00)
Chloride: 103 mmol/L (ref 101–111)
GFR calc non Af Amer: >60 mL/min (ref >60)
GLUCOSE: 102 mg/dL — AB (ref 65–99)
Potassium: 3.8 mmol/L (ref 3.5–5.1)
Sodium: 134 mmol/L — ABNORMAL LOW (ref 135–145)

## 2015-12-23 MED ORDER — CLINDAMYCIN HCL 300 MG PO CAPS: 300.0000 mg | ORAL_CAPSULE | Freq: Three times a day (TID) | ORAL | Status: DC

## 2015-12-23 MED ORDER — OXYCODONE-ACETAMINOPHEN 5-325 MG PO TABS
1.0000 | ORAL_TABLET | Freq: Once | ORAL | Status: AC
  Administered 2015-12-23: 1 via ORAL
  Filled 2015-12-23: qty 1

## 2015-12-23 MED ORDER — CLINDAMYCIN PHOSPHATE 600 MG/50ML IV SOLN
600.0000 mg | Freq: Once | INTRAVENOUS | Status: AC
  Administered 2015-12-23: 600 mg via INTRAVENOUS
  Filled 2015-12-23: qty 50

## 2015-12-23 MED ORDER — HYDROCODONE-ACETAMINOPHEN 5-325 MG PO TABS
1.0000 | ORAL_TABLET | Freq: Four times a day (QID) | ORAL | Status: DC | PRN
Start: 2015-12-23 — End: 2023-04-13

## 2015-12-23 MED ORDER — SODIUM CHLORIDE 0.9 % IV BOLUS (SEPSIS)
1000.0000 mL | Freq: Once | INTRAVENOUS | Status: AC
  Administered 2015-12-23: 1000 mL via INTRAVENOUS

## 2015-12-23 NOTE — Telephone Encounter (Signed)
Spoke with Asher MuirJamie, a PA from the ER whom states patient is in ED at current moment with 7-8cm Pilonidal Cyst that is indurated. Patient has been on PO antibiotics per PCP but will be given IV dose of Clindamycin and then sent home on PO meds. She does not feel that patient needs to be emergently by Surgeon on Call today but would like patient to be seen within the next 24 hours. Patient placed on schedule as work in at [employer]11am tomorrow to see if anything further needs to be done with patient at this time. PA also states patient is Afebrile in ED today.

## 2015-12-23 NOTE — ED Provider Notes (Signed)
[facility] Provider Note  ____________________________________________  Time seen: Approximately 10:52 AM  I have reviewed the triage vital signs and the nursing notes.   HISTORY  Chief Complaint Abscess    HPI Alger MemosWhitney D Amore is a 25 y.o. female , NAD, presents to the emergency department with 1 week history of abscess about her buttocks. [facility] and placed on antibiotics. She states they did not have any "numbing medicine" to incise the lesion and asked that she return today for follow-up. She presented back to the clinic today and still no medication to numb her in the area to incise the wound was available. She presents to this emergency department for further assistance. States the pain about the area is increasing. Has felt feverish and chilled off and on. Has not had any drainage about the area but does note some skin sloughing.   Past Medical History  Diagnosis Date  . Hypertension   . Asthma     There are no active problems to display for this patient.   History reviewed. No pertinent past surgical history.  Current Outpatient Rx  Name  Route  Sig  Dispense  Refill  . amoxicillin-clavulanate (AUGMENTIN) 875-125 MG tablet   Oral   Take 1 tablet by mouth 2 (two) times daily.         . metroNIDAZOLE (FLAGYL) 500 MG tablet   Oral   Take 500 mg by mouth 3 (three) times daily.         . clindamycin (CLEOCIN) 300 MG capsule   Oral   Take 1 capsule (300 mg total) by mouth 3 (three) times daily.   30 capsule   0   . HYDROcodone-acetaminophen (NORCO) 5-325 MG tablet   Oral   Take 1 tablet by mouth every 6 (six) hours as needed for severe pain.   6 tablet   0     Allergies Review of patient's allergies indicates no known allergies.  No family history on file.  Social History Social History  Substance Use Topics  . Smoking status: Current Every Day Smoker   . Smokeless tobacco: None  . Alcohol Use: Yes     Review of Systems  Constitutional: Positive fever, chills. No fatigue. Cardiovascular: No chest pain. Respiratory:  No shortness of breath. No wheezing.  Gastrointestinal: No abdominal pain.  No nausea, vomiting. Musculoskeletal: Negative for back pain.  Skin: Positive skin sore, redness, swelling about buttocks. Negative for rash. Neurological: Negative for headaches, focal weakness or numbness. 10-point ROS otherwise negative.  ____________________________________________   PHYSICAL EXAM:  VITAL SIGNS: ED Triage Vitals  Enc Vitals Group     BP 12/23/15 1032 156/108 mmHg     Pulse Rate 12/23/15 1032 102     Resp 12/23/15 1032 18     Temp 12/23/15 1032 99.2 F (37.3 C)     Temp Source 12/23/15 1032 Oral     SpO2 12/23/15 1032 98 %     Weight 12/23/15 1032 240 lb (108.863 kg)     Height 12/23/15 1032 5\' 2"  (1.575 m)     Head Cir --      Peak Flow --      Pain Score 12/23/15 1046 7     Pain Loc --      Pain Edu? --      Excl. in GC? --      Constitutional: Alert and oriented. Well appearing and in  no acute distress but in pain. Eyes: Conjunctivae are normal.  Head: Atraumatic. Neck: Supple with full range of motion Hematological/Lymphatic/Immunilogical: No cervical lymphadenopathy. Cardiovascular: Good peripheral circulation. Respiratory: Normal respiratory effort without tachypnea or retractions. Neurologic:  Normal speech and language. No gross focal neurologic deficits are appreciated.  Skin:  Skin about upper portion of natal cleft and bilateral buttocks significantly tender to palpation and indurated with an approximate area measuring 7 cm x 4 cm. Skin is red, warm to touch. No open wounds or lesions. No oozing or weeping. A 2 cm x 1 cm oblong area of skin sloughing noted about the right upper gluteal cheek. No rash noted. Psychiatric: Mood and affect are normal. Speech and behavior are normal. Patient exhibits  appropriate insight and judgement.   ____________________________________________   LABS (all labs ordered are listed, but only abnormal results are displayed)  Labs Reviewed  BASIC METABOLIC PANEL - Abnormal; Notable for the following:    Sodium 134 (*)    Glucose, Bld 102 (*)    Calcium 8.5 (*)    All other components within normal limits  CBC WITH DIFFERENTIAL/PLATELET - Abnormal; Notable for the following:    WBC 16.0 (*)    Neutro Abs 13.0 (*)    Monocytes Absolute 1.1 (*)    All other components within normal limits   ____________________________________________  EKG  None ____________________________________________  RADIOLOGY  None ____________________________________________    PROCEDURES  Procedure(s) performed: None      Medications  sodium chloride 0.9 % bolus 1,000 mL (1,000 mLs Intravenous New Bag/Given 12/23/15 1141)  clindamycin (CLEOCIN) IVPB 600 mg (600 mg Intravenous New Bag/Given 12/23/15 1141)  oxyCODONE-acetaminophen (PERCOCET/ROXICET) 5-325 MG per tablet 1 tablet (1 tablet Oral Given 12/23/15 1135)    ----------------------------------------- 12:30 PM on 12/23/2015 -----------------------------------------  Patient verbalizes feeling some better since being given pain medication. She is approximately halfway through her IV fluids and has completed clindamycin IV without any side effects noted at this time. ____________________________________________   INITIAL IMPRESSION / ASSESSMENT AND PLAN / ED COURSE  Pertinent lab results that were available during my care of the patient were reviewed by me and considered in my medical decision making (see chart for details).  Patient's diagnosis is consistent with pilonidal abscess of natal cleft. Patient will be discharged home with prescriptions for clindamycin and Norco to take as directed. I was able to schedule the patient an appointment [facility] at 11 AM tomorrow,  12/24/2015. Patient is aware she is to present to that office at 11 AM for consult. Patient is given ED precautions to return to the ED for any worsening or new symptoms.      ____________________________________________  FINAL CLINICAL IMPRESSION(S) / ED DIAGNOSES  Final diagnoses:  Pilonidal abscess of natal cleft      NEW MEDICATIONS STARTED DURING THIS VISIT:  New Prescriptions   CLINDAMYCIN (CLEOCIN) 300 MG CAPSULE    Take 1 capsule (300 mg total) by mouth 3 (three) times daily.   HYDROCODONE-ACETAMINOPHEN (NORCO) 5-325 MG TABLET    Take 1 tablet by mouth every 6 (six) hours as needed for severe pain.         Hope Pigeon, PA-C 12/23/15 1308  Jene Every, MD 12/23/15 1309

## 2015-12-23 NOTE — Discharge Instructions (Signed)
Pilonidal Cyst  A pilonidal cyst is a fluid-filled sac. It forms beneath the skin near your tailbone, at the top of the crease of your buttocks. A pilonidal cyst that is not large or infected may not cause symptoms or problems.  If the cyst becomes irritated or infected, it may fill with pus. This causes pain and swelling (pilonidal abscess). An infected cyst may need to be treated with medicine, drained, or removed.  CAUSES  The cause of a pilonidal cyst is not known. One cause may be a hair that grows into your skin (ingrown hair).  RISK FACTORS  Pilonidal cysts are more common in boys and men. Risk factors include:  · Having lots of hair near the crease of the buttocks.  · Being overweight.  · Having a pilonidal dimple.  · Wearing tight clothing.  · Not bathing or showering frequently.  · Sitting for long periods of time.  SIGNS AND SYMPTOMS  Signs and symptoms of a pilonidal cyst may include:  · Redness.  · Pain and tenderness.  · Warmth.  · Swelling.  · Pus.  · Fever.  DIAGNOSIS  Your health care provider may diagnose a pilonidal cyst based on your symptoms and a physical exam. The health care provider may do a blood test to check for infection. If your cyst is draining pus, your health care provider may take a sample of the drainage to be tested at a laboratory.  TREATMENT  Surgery is the usual treatment for an infected pilonidal cyst. You may also have to take medicines before surgery. The type of surgery you have depends on the size and severity of the infected cyst. The different kinds of surgery include:  · Incision and drainage. This is a procedure to open and drain the cyst.  · Marsupialization. In this procedure, a large cyst or abscess may be opened and kept open by stitching the edges of the skin to the cyst walls.  · Cyst removal. This procedure involves opening the skin and removing all or part of the cyst.  HOME CARE INSTRUCTIONS  · Follow all of your surgeon's instructions carefully if you had  surgery.  · Take medicines only as directed by your health care provider.  · If you were prescribed an antibiotic medicine, finish it all even if you start to feel better.  · Keep the area around your pilonidal cyst clean and dry.  · Clean the area as directed by your health care provider. Pat the area dry with a clean towel. Do not rub it as this may cause bleeding.  · Remove hair from the area around the cyst as directed by your health care provider.  · Do not wear tight clothing or sit in one place for long periods of time.  · There are many different ways to close and cover an incision, including stitches, skin glue, and adhesive strips. Follow your health care provider's instructions on:    Incision care.    Bandage (dressing) changes and removal.    Incision closure removal.  SEEK MEDICAL CARE IF:   · You have drainage, redness, swelling, or pain at the site of the cyst.  · You have a fever.     This information is not intended to replace advice given to you by your health care provider. Make sure you discuss any questions you have with your health care provider.     Document Released: 08/21/2000 Document Revised: 09/14/2014 Document Reviewed: 01/11/2014  Elsevier Interactive Patient   Education ©2016 [employer]  -

## 2015-12-23 NOTE — ED Notes (Signed)
States she developed abscess area to buttocks last week.. Was seen by her pmd and placed on antibiotics

## 2015-12-24 ENCOUNTER — Other Ambulatory Visit: Payer: Self-pay

## 2015-12-24 ENCOUNTER — Encounter: Payer: Self-pay | Admitting: General Surgery

## 2015-12-24 ENCOUNTER — Ambulatory Visit (INDEPENDENT_AMBULATORY_CARE_PROVIDER_SITE_OTHER): Payer: BLUE CROSS/BLUE SHIELD | Admitting: General Surgery

## 2015-12-24 ENCOUNTER — Other Ambulatory Visit
Admission: RE | Admit: 2015-12-24 | Discharge: 2015-12-24 | Disposition: A | Discharge status: Home / Self Care | Payer: BLUE CROSS/BLUE SHIELD | Source: Ambulatory Visit | Attending: General Surgery | Admitting: General Surgery

## 2015-12-24 VITALS — BP 177/126 | HR 112 | Temp 98.7°F | Ht 62.0 in | Wt 242.0 lb

## 2015-12-24 DIAGNOSIS — L0231 Cutaneous abscess of buttock: Secondary | ICD-10-CM | POA: Insufficient documentation

## 2015-12-24 DIAGNOSIS — L0390 Cellulitis, unspecified: Principal | ICD-10-CM

## 2015-12-24 DIAGNOSIS — L0291 Cutaneous abscess, unspecified: Secondary | ICD-10-CM

## 2015-12-24 HISTORY — DX: Cutaneous abscess of buttock: L02.31

## 2015-12-24 NOTE — Patient Instructions (Signed)
We have drained the abscess on your left buttocks today. You will need to return to our clinic at 2pm tomorrow for a packing change.  Continue taking your antibiotics as scheduled.

## 2015-12-24 NOTE — Progress Notes (Signed)
Patient ID: Julie Reid, female   DOB: 06-23-1991, 25 y.o.   MRN: [id number]  CC: ABSCESS  HPI Julie MemosWhitney D Laduke is a 25 y.o. female who presents to surgery clinic today for evaluation of the abscess just above her buttocks mostly on her left. Patient states is been there for the last needed over the last 2 days become exquisitely painful. She has a history of boils before but never in this area never this bad before. She was seen in 2 different emergency departments including the one here today [facility] over the last 3 days. [facility] permanent [facility] and was sent to our clinic today without intervention. Patient states the area continued to enlarge and become painful. It spontaneously ruptured in the car ride to our clinic today draining a foul-smelling fluid. She has had subjective fevers but no objective fevers at home. She denies any chills, nausea, vomiting, chest pain, shortness of breath, diarrhea, constipation. She is otherwise in her usual state of health.  HPI  Past Medical History  Diagnosis Date  . Hypertension   . Asthma   . Abscess of buttock, left 12/24/15    Past Surgical History  Procedure Laterality Date  . No past surgeries      Family History  Problem Relation Age of Onset  . Hypertension Mother   . Diabetes Mother   . Hypertension Father   . Heart disease Father     Social History Social History  Substance Use Topics  . Smoking status: Current Every Day Smoker  . Smokeless tobacco: Never Used  . Alcohol Use: Yes     Comment: 2 Beers Weekly    No Known Allergies  Current Outpatient Prescriptions  Medication Sig Dispense Refill  . amLODipine (NORVASC) 10 MG tablet Take 1 tablet by mouth daily.  0  . clindamycin (CLEOCIN) 300 MG capsule Take 1 capsule (300 mg total) by mouth 3 (three) times daily. 30 capsule 0  . hydrochlorothiazide (HYDRODIURIL) 25 MG tablet Take 1 tablet by mouth daily.  0  . HYDROcodone-acetaminophen (NORCO) 5-325 MG  tablet Take 1 tablet by mouth every 6 (six) hours as needed for severe pain. 6 tablet 0  . losartan (COZAAR) 50 MG tablet Take 1 tablet by mouth daily.  0   No current facility-administered medications for this visit.     Review of Systems A Multi-point review of systems was asked and was negative except for the findings documented in the history of present illness  Physical Exam Blood pressure 177/126, pulse 112, temperature 98.7 F (37.1 C), temperature source Oral, height 5\' 2"  (1.575 m), weight 109.77 kg (242 lb). CONSTITUTIONAL: No acute distress. EYES: Pupils are equal, round, and reactive to light, Sclera are non-icteric. EARS, NOSE, MOUTH AND THROAT: The oropharynx is clear. The oral mucosa is pink and moist. Hearing is intact to voice. LYMPH NODES:  Lymph nodes in the neck are normal. RESPIRATORY:  Lungs are clear. There is normal respiratory effort, with equal breath sounds bilaterally, and without pathologic use of accessory muscles. CARDIOVASCULAR: Heart is regular without murmurs, gallops, or rubs. GI: The abdomen is large, soft, nontender, and nondistended. There are no palpable masses. There is no hepatosplenomegaly. There are normal bowel sounds in all quadrants. GU: Rectal deferred.   MUSCULOSKELETAL: Normal muscle strength and tone. No cyanosis or edema.   SKIN: In the skin that is just cephalad to the natal cleft mostly on the left patient has evidence of cellulitis, induration, fluctuance. There  is an expressible purulent fluid from the superior aspect of this. The area of fluctuance measures approximately 7 x 6 cm. NEUROLOGIC: Motor and sensation is grossly normal. Cranial nerves are grossly intact. PSYCH:  Oriented to person, place and time. Affect is normal.  Data Reviewed Labs from the ER yesterday show a leukocytosis of 16,000 I have personally reviewed the patient's imaging, laboratory findings and medical records.    Assessment    Large abscess emanating  from the cephalad aspect of the left buttocks    Plan    Patient has a very large abscess that is only just now started to spontaneously drain. Given her subjective fevers and her leukocytosis from 24 hours prior and the large area of fluctuance counseled her that she needs an incision and drainage of the area. Informed consent was obtained and the patient underwent an incision and drainage of the large abscess in the clinic today with immediate relief of the majority of her symptoms. The area was packed with iodoform packing and she'll return to clinic in 24 hours for packing removal and exchange. Counseled her to continue her already prescribed antibiotics. We will change her antibiotics should she fail to improve or if the culture that was obtained today gross bacteria that are not sensitive to the clindamycin.  Procedure in detail: After an informed consent was obtained patient was placed in the prone position where the obvious abscess was prepped with Betadine and sterilely draped. Using a 50-50 mixture of 1% lidocaine and nuet the area around the spontaneous draining site was localized and confirmed known. Using an 11 blade scalpel the abscess cavity sharply entered into with immediate eruption of foul-smelling purulent appearing fluid. The incision was enlarged and made cruciate with the 11 blade scalpel. The fluid was cultured with a culture swab. With gentle pressure approximately 50 mL of purulent fluid was able to be evacuated. The abscess cavity was then copiously irrigated with saline until her irrigation returned clear. The wound was then probed and again irrigated with clear return of fluid. We then packed the area with iodoform gauze and placed a plain gauze dressing over the top of this for continued drainage.     Time spent with the patient was 60 minutes, with more than 50% of the time spent in face-to-face education, counseling and care coordination.     Ricarda Frame, MD  FACS General Surgeon 12/24/2015, 12:23 PM

## 2015-12-25 ENCOUNTER — Ambulatory Visit (INDEPENDENT_AMBULATORY_CARE_PROVIDER_SITE_OTHER): Payer: BLUE CROSS/BLUE SHIELD | Admitting: General Surgery

## 2015-12-25 DIAGNOSIS — Z5189 Encounter for other specified aftercare: Secondary | ICD-10-CM

## 2015-12-25 NOTE — Progress Notes (Signed)
Outpatient Surgical Follow Up  12/25/2015  Julie Reid is an 25 y.o. female.   No chief complaint on file.   HPI: Patient returns to clinic for follow-up from recent I&D. She states she feels much better than she did yesterday. She denies any current fevers, chills, nausea, vomiting, diarrhea, constipation, chest pain, short of breath.  Past Medical History  Diagnosis Date  . Hypertension   . Asthma   . Abscess of buttock, left 12/24/15    Past Surgical History  Procedure Laterality Date  . No past surgeries      Family History  Problem Relation Age of Onset  . Hypertension Mother   . Diabetes Mother   . Hypertension Father   . Heart disease Father     Social History:  reports that she has been smoking.  She has never used smokeless tobacco. She reports that she drinks alcohol. She reports that she does not use illicit drugs.  Allergies: No Known Allergies  Medications reviewed.    ROS Multipoint review of systems was completed. All pertinent positives and negatives are documented within the history of present illness the remainder are negative   There were no vitals taken for this visit.  Physical Exam  Gen.: No acute distress Chest: Clear to auscultation Heart: Regular rhythm Abdomen old soft and nontender Skin: Previous left gluteal abscess drainage site visualized with markedly decreased induration and erythema. No continued purulent drainage. Packing removed.   Results for orders placed or performed during the hospital encounter of 12/24/15 (from the past 48 hour(s))  Wound culture     Status: None (Preliminary result)   Collection Time: 12/24/15 11:22 AM  Result Value Ref Range   Specimen Description BUTTOCKS    Special Requests NONE    Gram Stain PENDING    Culture CULTURE REINCUBATED FOR BETTER GROWTH    Report Status PENDING    No results found.  Assessment/Plan:  1. Wound check, abscess One day status post I&D of left gluteal/pilonidal  abscess. Doing well. Packing removed and replaced. Discussed packing care with the patient in detail. She is to follow-up in clinic in 1 week for additional wound check.     Ricarda Frameharles Mihran Lebarron, MD FACS General Surgeon  12/25/2015,6:26 PM

## 2015-12-27 LAB — WOUND CULTURE: CULTURE: NORMAL

## 2015-12-30 ENCOUNTER — Telehealth: Payer: Self-pay

## 2015-12-31 NOTE — Telephone Encounter (Signed)
Call was made to Microbiology and asked to run sensitivities on culture. Staff states that this cannot be done as there is too many different types of bacteria involved in culture and this would not be useful at all.  Reviewed the Culture results with Dr. Everlene FarrierPabon and explained situation above. He asked that I call patient and if she is not doing better we will switch antibiotics but if she is doing ok, she will follow-up as scheduled with Dr. Tonita CongWoodham on 01/01/16 without changing antibiotics.  Patient was called and is doing well. Antibiotics were not changed. Will follow-up with Dr. Tonita CongWoodham as planned.

## 2016-01-01 ENCOUNTER — Other Ambulatory Visit: Payer: Self-pay

## 2016-01-01 ENCOUNTER — Encounter: Payer: Self-pay | Admitting: General Surgery

## 2016-01-01 ENCOUNTER — Ambulatory Visit (INDEPENDENT_AMBULATORY_CARE_PROVIDER_SITE_OTHER): Payer: BLUE CROSS/BLUE SHIELD | Admitting: General Surgery

## 2016-01-01 VITALS — BP 148/94 | HR 76 | Temp 98.1°F | Ht 62.0 in | Wt 242.8 lb

## 2016-01-01 DIAGNOSIS — Z09 Encounter for follow-up examination after completed treatment for conditions other than malignant neoplasm: Secondary | ICD-10-CM

## 2016-01-01 NOTE — Patient Instructions (Signed)
Please call our office if you have questions or concerns.   

## 2016-01-01 NOTE — Progress Notes (Signed)
Outpatient Surgical Follow Up  01/01/2016  Julie MormonWhitney Reid Julie MillinOliver is an 25 y.o. female.   Chief Complaint  Patient presents with  . Follow-up    Left Buttocks Abscess    HPI: 25 year old female returns to clinic for follow-up examination after drainage of left gluteal abscess. Patient reports feeling much better. She denies any fevers, chills, nausea, vomiting, diarrhea, constipation. She states the pain the areas on was completely gone. Continued have a small amount of drainage on her dressings but it has changed to a straw-colored fluid. She's been very happy with her care, but she is not quite finished her antibiotics.  Past Medical History  Diagnosis Date  . Hypertension   . Asthma   . Abscess of buttock, left 12/24/15    Past Surgical History  Procedure Laterality Date  . No past surgeries      Family History  Problem Relation Age of Onset  . Hypertension Mother   . Diabetes Mother   . Hypertension Father   . Heart disease Father     Social History:  reports that she has been smoking.  She has never used smokeless tobacco. She reports that she drinks alcohol. She reports that she does not use illicit drugs.  Allergies: No Known Allergies  Medications reviewed.    ROS  A multipoint review of systems was completed. All pertinent positives and negatives are documented within the history of present illness the remainder negative.  BP 148/94 mmHg  Pulse 76  Temp(Src) 98.1 F (36.7 C) (Oral)  Ht 5\' 2"  (1.575 m)  Wt 110.133 kg (242 lb 12.8 oz)  BMI 44.40 kg/m2  Physical Exam  Gen.: No acute distress Chest: Clear to auscultation Heart: Regular rate and rhythm Abdomen: Soft and nontender Rectum: Previous I&Reid site visualized just left of midline superior to the anus. The previous area of abscesses almost completely resolved with only minor skin opening from the I&Reid site. No evidence of purulent drainage or erythema on exam.   No results found for this or any previous  visit (from the past 48 hour(s)). No results found.  Assessment/Plan:  1. Follow-up examination following surgery 25 year old female status post I&Reid of left gluteal abscess. Doing well. Discussed completing her course of antibiotics. Also discussed keeping the area covered until there is no drainage on the dressing. All questions were answered the patient's satisfaction she'll follow-up in clinic on an as-needed basis.     Ricarda Frameharles Gorden Stthomas, MD FACS General Surgeon  01/01/2016,12:59 PM

## 2016-09-12 ENCOUNTER — Ambulatory Visit
Admission: EM | Admit: 2016-09-12 | Discharge: 2016-09-12 | Disposition: A | Discharge status: Home / Self Care | Payer: BLUE CROSS/BLUE SHIELD | Attending: Emergency Medicine | Admitting: Emergency Medicine

## 2016-09-12 ENCOUNTER — Encounter: Payer: Self-pay | Admitting: Emergency Medicine

## 2016-09-12 DIAGNOSIS — J111 Influenza due to unidentified influenza virus with other respiratory manifestations: Secondary | ICD-10-CM

## 2016-09-12 DIAGNOSIS — R69 Illness, unspecified: Secondary | ICD-10-CM

## 2016-09-12 LAB — RAPID STREP SCREEN (MED CTR MEBANE ONLY): Streptococcus, Group A Screen (Direct): NEGATIVE

## 2016-09-12 MED ORDER — BENZONATATE 100 MG PO CAPS
100.0000 mg | ORAL_CAPSULE | Freq: Three times a day (TID) | ORAL | 0 refills | Status: AC | PRN
Start: 2016-09-12 — End: ?

## 2016-09-12 NOTE — ED Provider Notes (Signed)
[facility] ____________________________________________  Time seen: Approximately 5:08 PM  I have reviewed the triage vital signs and the nursing notes.   HISTORY  Chief Complaint Cough and Sore Throat   HPI Julie Reid is a 26 y.o. female presents with a complaint of runny nose, sore throat and cough since yesterday. Patient reports some chills and body aches, but denies known fevers. Reports has not been taking medications for the same complaints. Reports she has had multiple sick contacts in family with similar. Reports continues to still eat and drink well.  Denies dysuria, abdominal pain, chest pain, shortness of breath, extremity pain or extremity swelling. Denies rash. Reports has continued to remain active. Denies any recent sickness or recent antibiotic use. Patient reports that she had to call out of work yesterday and today and needs a work note.  No LMP recorded. Patient has had an implant. Denies pregnancy.      Past Medical History:  Diagnosis Date  . Abscess of buttock, left 12/24/15  . Asthma   . Hypertension     Patient Active Problem List   Diagnosis Date Noted  . Abscess of buttock, left 12/24/2015  . 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 History of cesarean section 01/09/2013  . BP (high blood pressure) 11/22/2012  . Maternal tobacco use 11/22/2012    Past Surgical History:  Procedure Laterality Date  . NO PAST SURGERIES      No current facility-administered medications for this encounter.   Current Outpatient Prescriptions:  .  amLODipine (NORVASC) 10 MG tablet, Take 1 tablet by mouth daily., Disp: , Rfl: 0 .  hydrochlorothiazide (HYDRODIURIL) 25 MG tablet, Take 1 tablet by mouth daily., Disp: , Rfl: 0 .  losartan (COZAAR) 50 MG tablet, Take 1 tablet by mouth daily., Disp: , Rfl: 0 .  benzonatate (TESSALON PERLES) 100 MG capsule, Take 1 capsule (100 mg total) by mouth 3 (three) times daily as  needed., Disp: 15 capsule, Rfl: 0 .  clindamycin (CLEOCIN) 300 MG capsule, Take 1 capsule (300 mg total) by mouth 3 (three) times daily., Disp: 30 capsule, Rfl: 0 .  fluconazole (DIFLUCAN) 150 MG tablet, 1 tablet., Disp: , Rfl: 0 .  HYDROcodone-acetaminophen (NORCO) 5-325 MG tablet, Take 1 tablet by mouth every 6 (six) hours as needed for severe pain., Disp: 6 tablet, Rfl: 0  Allergies Patient has no known allergies.  Family History  Problem Relation Age of Onset  . Hypertension Mother   . Diabetes Mother   . Hypertension Father   . Heart disease Father     Social History Social History  Substance Use Topics  . Smoking status: Current Every Day Smoker  . Smokeless tobacco: Never Used  . Alcohol use Yes     Comment: 2 Beers Weekly    Review of Systems Constitutional: As above. Eyes: No visual changes. ENT: States mild sore throat. Cardiovascular: Denies chest pain. Respiratory: Denies shortness of breath. Gastrointestinal: No abdominal pain.  No nausea, no vomiting.  No diarrhea.  No constipation. Genitourinary: Negative for dysuria. Musculoskeletal: Negative for back pain. Skin: Negative for rash. Neurological: Negative for headaches, focal weakness or numbness.  10-point ROS otherwise negative.  ____________________________________________   PHYSICAL EXAM:  VITAL SIGNS: ED Triage Vitals  Enc Vitals Group     BP 09/12/16 1617 134/79     Pulse Rate 09/12/16 1617 83     Resp 09/12/16 1617 16     Temp 09/12/16 1617 97.7 F (36.5 C)  Temp Source 09/12/16 1617 Tympanic     SpO2 09/12/16 1617 99 %     Weight 09/12/16 1619 230 lb (104.3 kg)     Height 09/12/16 1619 5\' 2"  (1.575 m)     Head Circumference --      Peak Flow --      Pain Score 09/12/16 1620 7     Pain Loc --      Pain Edu? --      Excl. in GC? --     Constitutional: Alert and oriented. Well appearing and in no acute distress. Eyes: Conjunctivae are normal. PERRL. EOMI. Head: Atraumatic. No  sinus tenderness to palpation. No swelling. No erythema.  Ears: no erythema, normal TMs bilaterally.   Nose:Nasal congestion with clear rhinorrhea  Mouth/Throat: Mucous membranes are moist. Mild pharyngeal erythema. No tonsillar swelling or exudate.  Neck: No stridor.  No cervical spine tenderness to palpation. Hematological/Lymphatic/Immunilogical: No cervical lymphadenopathy. Cardiovascular: Normal rate, regular rhythm. Grossly normal heart sounds.  Good peripheral circulation. Respiratory: Normal respiratory effort.  No retractions. No wheezes, rales or rhonchi. Good air movement.  Gastrointestinal: Soft and nontenderNo CVA tenderness. Musculoskeletal: Ambulatory with steady gait. No cervical, thoracic or lumbar tenderness to palpation. Neurologic:  Normal speech and language. No gait instability. Skin:  Skin is warm, dry and intact. No rash noted. Psychiatric: Mood and affect are normal. Speech and behavior are normal.  ___________________________________________   LABS (all labs ordered are listed, but only abnormal results are displayed)  Labs Reviewed  RAPID STREP SCREEN (NOT AT [facility])  CULTURE, GROUP A STREP [facility])    PROCEDURES Procedures    INITIAL IMPRESSION / ASSESSMENT AND PLAN / ED COURSE  Pertinent labs & imaging results that were available during my care of the patient were reviewed by me and considered in my medical decision making (see chart for details).  Well-appearing patient. No acute distress. Suspect influenza-like illness. Patient states she does not want Tamiflu. Encourage rest, fluids, supportive care. When necessary Tessalon Perles. Work note given for today and tomorrow.Discussed indication, risks and benefits of medications with patient.  Discussed follow up with Primary care physician this week. Discussed follow up and return parameters including no resolution or any worsening concerns. Patient verbalized understanding and agreed to plan.    ____________________________________________   FINAL CLINICAL IMPRESSION(S) / ED DIAGNOSES  Final diagnoses:  Influenza-like illness     Discharge Medication List as of 09/12/2016  5:12 PM    START taking these medications   Details  benzonatate (TESSALON PERLES) 100 MG capsule Take 1 capsule (100 mg total) by mouth 3 (three) times daily as needed., Starting Sat 09/12/2016, Normal        Note: This dictation was prepared with Dragon dictation along with smaller phrase technology. Any transcriptional errors that result from this process are unintentional.    Clinical Course       Renford DillsLindsey Aariyana Manz, NP 09/13/16 0900    Renford DillsLindsey Tevon Berhane, NP 09/13/16 [phone]

## 2016-09-12 NOTE — Discharge Instructions (Signed)
Take medication as prescribed. Rest. Drink plenty of fluids.  ° °Follow up with your primary care physician this week as needed. Return to Urgent care for new or worsening concerns.  ° °

## 2016-09-12 NOTE — ED Triage Notes (Signed)
Cough, sore throat since yesterday

## 2016-09-15 LAB — CULTURE, GROUP A STREP (THRC)

## 2017-05-31 ENCOUNTER — Encounter: Payer: Self-pay | Admitting: Emergency Medicine

## 2017-05-31 ENCOUNTER — Emergency Department
Admission: EM | Admit: 2017-05-31 | Discharge: 2017-05-31 | Disposition: A | Discharge status: Home / Self Care | Payer: Self-pay | Attending: Emergency Medicine | Admitting: Emergency Medicine

## 2017-05-31 DIAGNOSIS — I10 Essential (primary) hypertension: Secondary | ICD-10-CM | POA: Insufficient documentation

## 2017-05-31 DIAGNOSIS — Z79899 Other long term (current) drug therapy: Secondary | ICD-10-CM | POA: Insufficient documentation

## 2017-05-31 DIAGNOSIS — F17210 Nicotine dependence, cigarettes, uncomplicated: Secondary | ICD-10-CM | POA: Insufficient documentation

## 2017-05-31 DIAGNOSIS — J45909 Unspecified asthma, uncomplicated: Secondary | ICD-10-CM | POA: Insufficient documentation

## 2017-05-31 MED ORDER — ALBUTEROL SULFATE HFA 108 (90 BASE) MCG/ACT IN AERS: 2.0000 | INHALATION_SPRAY | Freq: Four times a day (QID) | RESPIRATORY_TRACT | 0 refills | Status: AC | PRN

## 2017-05-31 MED ORDER — METHYLPREDNISOLONE SODIUM SUCC 125 MG IJ SOLR
125.0000 mg | Freq: Once | INTRAMUSCULAR | Status: AC
  Administered 2017-05-31: 125 mg via INTRAMUSCULAR
  Filled 2017-05-31: qty 2

## 2017-05-31 MED ORDER — PREDNISONE 10 MG PO TABS: ORAL_TABLET | ORAL | 0 refills | Status: DC

## 2017-05-31 MED ORDER — ALBUTEROL SULFATE (2.5 MG/3ML) 0.083% IN NEBU
2.5000 mg | INHALATION_SOLUTION | Freq: Four times a day (QID) | RESPIRATORY_TRACT | 12 refills | Status: AC | PRN
Start: 2017-05-31 — End: ?

## 2017-05-31 MED ORDER — IPRATROPIUM-ALBUTEROL 0.5-2.5 (3) MG/3ML IN SOLN
3.0000 mL | Freq: Once | RESPIRATORY_TRACT | Status: AC
  Administered 2017-05-31: 3 mL via RESPIRATORY_TRACT
  Filled 2017-05-31: qty 3

## 2017-05-31 NOTE — ED Provider Notes (Signed)
[facility] Emergency Department Provider Note  ____________________________________________  Time seen: Approximately 2:49 PM  I have reviewed the triage vital signs and the nursing notes.   HISTORY  Chief Complaint Cough and Numbness    HPI Julie Reid is a 26 y.o. female presents to emergency department for evaluation of wheezing and cough for 2 days.Patient states that she has a history of asthma and this feels the same. Cough is nonproductive. She is not having any difficulty breathing but it feels tight with deep breaths. She ran out of her inhalers so no alleviating measures have been attempted. She woke up this morning and felt numbness and tingling in her right hand. It was feeling more numb than tingling. It has been getting better but has happened a couple times since then. She is not having any numbness or tingling now. No trauma. No recent illness. She denies fever, headache, visual changes, neck pain, weakness, shortness of breath, chest pain, nausea, vomiting    Past Medical History:  Diagnosis Date  . Abscess of buttock, left 12/24/15  . Asthma   . Hypertension     Patient Active Problem List   Diagnosis Date Noted  . Abscess of buttock, left 12/24/2015  . 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 History of cesarean section 01/09/2013  . BP (high blood pressure) 11/22/2012  . Maternal tobacco use 11/22/2012    Past Surgical History:  Procedure Laterality Date  . NO PAST SURGERIES      Prior to Admission medications   Medication Sig Start Date End Date Taking? Authorizing Provider  albuterol (PROVENTIL HFA;VENTOLIN HFA) 108 (90 Base) MCG/ACT inhaler Inhale 2 puffs into the lungs every 6 (six) hours as needed for wheezing or shortness of breath. 05/31/17   Enid Derry, PA-C  albuterol (PROVENTIL) (2.5 MG/3ML) 0.083% nebulizer solution Take 3 mLs (2.5 mg total) by nebulization every 6 (six) hours as  needed for wheezing or shortness of breath. 05/31/17   Enid Derry, PA-C  amLODipine (NORVASC) 10 MG tablet Take 1 tablet by mouth daily. 12/18/15   [provider]  benzonatate (TESSALON PERLES) 100 MG capsule Take 1 capsule (100 mg total) by mouth 3 (three) times daily as needed. 09/12/16   Renford Dills, NP  clindamycin (CLEOCIN) 300 MG capsule Take 1 capsule (300 mg total) by mouth 3 (three) times daily. 12/23/15   Hagler, Jami L, PA-C  fluconazole (DIFLUCAN) 150 MG tablet 1 tablet. 12/26/15   [provider]  hydrochlorothiazide (HYDRODIURIL) 25 MG tablet Take 1 tablet by mouth daily. 12/18/15   [provider]  HYDROcodone-acetaminophen (NORCO) 5-325 MG tablet Take 1 tablet by mouth every 6 (six) hours as needed for severe pain. 12/23/15   Hagler, Jami L, PA-C  losartan (COZAAR) 50 MG tablet Take 1 tablet by mouth daily. 12/18/15   [provider]  predniSONE (DELTASONE) 10 MG tablet Take 6 tablets on day 1, take 5 tablets on day 2, take 4 tablets on day 3, take 3 tablets on day 4, take 2 tablets on day 5, take 1 tablet on day 6 05/31/17   Enid Derry, PA-C    Allergies Patient has no known allergies.  Family History  Problem Relation Age of Onset  . Hypertension Mother   . Diabetes Mother   . Hypertension Father   . Heart disease Father     Social History Social History  Substance Use Topics  . Smoking status: Current Every Day Smoker  Packs/day: 1.00    Types: Cigarettes  . Smokeless tobacco: Never Used  . Alcohol use Yes     Comment: 2 Beers Weekly     Review of Systems  Constitutional: No fever/chills Eyes: No visual changes. No discharge. ENT: Negative for congestion and rhinorrhea. Cardiovascular: No chest pain. Respiratory: Positive for cough. No SOB. Gastrointestinal: No abdominal pain.  No nausea, no vomiting.  No diarrhea.  No constipation. Musculoskeletal: Negative for musculoskeletal pain. Skin: Negative for rash,  abrasions, lacerations, ecchymosis. Neurological: Negative for headaches.   ____________________________________________   PHYSICAL EXAM:  VITAL SIGNS: ED Triage Vitals  Enc Vitals Group     BP 05/31/17 1310 (!) 157/98     Pulse Rate 05/31/17 1310 73     Resp 05/31/17 1310 18     Temp 05/31/17 1310 97.8 F (36.6 C)     Temp Source 05/31/17 1310 Oral     SpO2 05/31/17 1310 99 %     Weight 05/31/17 1311 245 lb (111.1 kg)     Height 05/31/17 1311  (1.575 m)     Head Circumference --      Peak Flow --      Pain Score 05/31/17 1310 5     Pain Loc --      Pain Edu? --      Excl. in GC? --      Constitutional: Alert and oriented. Well appearing and in no acute distress. Eyes: Conjunctivae are normal. PERRL. EOMI. No discharge. Head: Atraumatic. ENT:       Ears:       Nose: Mild congestion/rhinnorhea.      Mouth/Throat: Mucous membranes are moist.  Neck: No stridor.   Hematological/Lymphatic/Immunilogical: No cervical lymphadenopathy. Cardiovascular: Normal rate, regular rhythm.  Good peripheral circulation. 2+ radial pulses. Respiratory: Normal respiratory effort without tachypnea or retractions. Lungs CTAB. Good air entry to the bases with no decreased or absent breath sounds. Gastrointestinal: Bowel sounds 4 quadrants. Soft and nontender to palpation. No guarding or rigidity. No palpable masses. No distention. Musculoskeletal: Full range of motion to all extremities. No gross deformities appreciated. Strength 5 out of 5 in upper and lower extremities. Neurologic:  Normal speech and language. No gross focal neurologic deficits are appreciated. Sensation of upper extremities intact bilaterally. Skin:  Skin is warm, dry and intact. No rash noted.     ____________________________________________   LABS (all labs ordered are listed, but only abnormal results are displayed)  Labs Reviewed - No data to  display ____________________________________________  EKG   ____________________________________________  RADIOLOGY   No results found.  ____________________________________________    PROCEDURES  Procedure(s) performed:    Procedures    Medications  methylPREDNISolone sodium succinate (SOLU-MEDROL) 125 mg/2 mL injection 125 mg (125 mg Intramuscular Given 05/31/17 1504)  ipratropium-albuterol (DUONEB) 0.5-2.5 (3) MG/3ML nebulizer solution 3 mL (3 mLs Nebulization Given 05/31/17 1506)     ____________________________________________   INITIAL IMPRESSION / ASSESSMENT AND PLAN / ED COURSE  Pertinent labs & imaging results that were available during my care of the patient were reviewed by me and considered in my medical decision making (see chart for details).  Review of the [place] CSRS was performed in accordance of the [facility] prior to dispensing any controlled drugs.   Patient presented to the emergency department for evaluation of wheezing and cough for 2 days and numbness in right hand for 1 day. Vital signs and exam are reassuring. We discussed doing a chest x-ray and blood work patient  would like to hold off at this time. She felt better after after DuoNeb. Numbness in right hand is improving and she has good sensation in the ER.  She was given IM Solu-Medrol. She does not appear in any  distress and is in the room laughing and on her cell phone. Patient feels comfortable going home.  Patient will be discharged home with prescriptions for prednisone, albuterol inhaler and nebulizer. Patient is to follow up with PCP as needed or otherwise directed. Patient is given ED precautions to return to the ED for any worsening or new symptoms.     ____________________________________________  FINAL CLINICAL IMPRESSION(S) / ED DIAGNOSES  Final diagnoses:  Uncomplicated asthma, unspecified asthma severity, unspecified whether persistent      NEW MEDICATIONS STARTED DURING THIS  VISIT:  Discharge Medication List as of 05/31/2017  3:27 PM    START taking these medications   Details  albuterol (PROVENTIL HFA;VENTOLIN HFA) 108 (90 Base) MCG/ACT inhaler Inhale 2 puffs into the lungs every 6 (six) hours as needed for wheezing or shortness of breath., Starting Mon 05/31/2017, Print    albuterol (PROVENTIL) (2.5 MG/3ML) 0.083% nebulizer solution Take 3 mLs (2.5 mg total) by nebulization every 6 (six) hours as needed for wheezing or shortness of breath., Starting Mon 05/31/2017, Print    predniSONE (DELTASONE) 10 MG tablet Take 6 tablets on day 1, take 5 tablets on day 2, take 4 tablets on day 3, take 3 tablets on day 4, take 2 tablets on day 5, take 1 tablet on day 6, Print            This chart was dictated using voice recognition software/Dragon. Despite best efforts to proofread, errors can occur which can change the meaning. Any change was purely unintentional.    Enid Derry, PA-C 06/01/17 1610    Pershing Proud Myra Rude, MD 06/01/17 [phone]

## 2017-05-31 NOTE — ED Triage Notes (Signed)
Cough x 2 days. Awoke with R hand pain and numbness which is resolving.

## 2018-05-16 ENCOUNTER — Encounter: Payer: Self-pay | Admitting: Emergency Medicine

## 2018-05-16 ENCOUNTER — Emergency Department
Admission: EM | Admit: 2018-05-16 | Discharge: 2018-05-16 | Disposition: A | Discharge status: Home / Self Care | Payer: BLUE CROSS/BLUE SHIELD | Attending: Emergency Medicine | Admitting: Emergency Medicine

## 2018-05-16 ENCOUNTER — Emergency Department: Payer: BLUE CROSS/BLUE SHIELD

## 2018-05-16 ENCOUNTER — Other Ambulatory Visit: Payer: Self-pay

## 2018-05-16 DIAGNOSIS — K219 Gastro-esophageal reflux disease without esophagitis: Secondary | ICD-10-CM | POA: Insufficient documentation

## 2018-05-16 DIAGNOSIS — I10 Essential (primary) hypertension: Secondary | ICD-10-CM | POA: Diagnosis not present

## 2018-05-16 DIAGNOSIS — F17210 Nicotine dependence, cigarettes, uncomplicated: Secondary | ICD-10-CM | POA: Insufficient documentation

## 2018-05-16 DIAGNOSIS — Z79899 Other long term (current) drug therapy: Secondary | ICD-10-CM | POA: Insufficient documentation

## 2018-05-16 DIAGNOSIS — R079 Chest pain, unspecified: Secondary | ICD-10-CM | POA: Diagnosis not present

## 2018-05-16 DIAGNOSIS — J45909 Unspecified asthma, uncomplicated: Secondary | ICD-10-CM | POA: Insufficient documentation

## 2018-05-16 LAB — POCT PREGNANCY, URINE: PREG TEST UR: NEGATIVE

## 2018-05-16 LAB — BASIC METABOLIC PANEL
ANION GAP: 8 (ref 5–15)
BUN: 12 mg/dL (ref 6–20)
CO2: 27 mmol/L (ref 22–32)
Calcium: 9 mg/dL (ref 8.9–10.3)
Chloride: 99 mmol/L (ref 98–111)
Creatinine, Ser: 0.68 mg/dL (ref 0.44–1.00)
GFR calc non Af Amer: >60 mL/min (ref >60)
GLUCOSE: 100 mg/dL — AB (ref 70–99)
POTASSIUM: 3.2 mmol/L — AB (ref 3.5–5.1)
Sodium: 134 mmol/L — ABNORMAL LOW (ref 135–145)

## 2018-05-16 LAB — CBC
HEMATOCRIT: 43.4 % (ref 35.0–47.0)
HEMOGLOBIN: 14.8 g/dL (ref 12.0–16.0)
MCH: 32.1 pg (ref 26.0–34.0)
MCHC: 34.2 g/dL (ref 32.0–36.0)
MCV: 93.8 fL (ref 80.0–100.0)
Platelets: 261 10*3/uL (ref 150–440)
RBC: 4.63 MIL/uL (ref 3.80–5.20)
RDW: 14.3 % (ref 11.5–14.5)
WBC: 10.9 10*3/uL (ref 3.6–11.0)

## 2018-05-16 LAB — TROPONIN I

## 2018-05-16 NOTE — Discharge Instructions (Signed)
You are evaluated for chest discomfort, and although no certain cause was found, your exam and evaluation are overall reassuring in the emergency department today.  Return to the emergency department immediately for any breathing troubles, shortness of breath, chest pain with breathing, new or uncontrolled chest pain, vomiting blood, skin rash, fever, or any other symptoms concerning to you.  We discussed strategies to help quit smoking including habit change as well as nicotine dependence.  For acid reflux/GERD, you may try over-the-counter Maalox and/or Tums, use as directed to help neutralize acid as needed for symptoms.  Please also try Prilosec 40 mg once daily for 10 to 14 days as your stomach heals.

## 2018-05-16 NOTE — ED Triage Notes (Signed)
C/o stabbing left chest pain since last night that is intermittent. Unlabored, denies SHOB. Skin warm and dry. No other symptoms. No similar hx.

## 2018-05-16 NOTE — ED Notes (Signed)
First Nurse Note:  Patient is complaining of centralized stabbing chest pain that began last night.  Patient is in no obvious distress at this time.  Ambulatory to registration desk without difficulty.

## 2018-05-16 NOTE — ED Provider Notes (Signed)
[facility] Emergency Department Provider Note ____________________________________________   I have reviewed the triage vital signs and the triage nursing note.  HISTORY  Chief Complaint Chest Pain   Historian Patient and mother in the room  HPI Julie Reid is a 27 y.o. female with a history of asthma and hypertension, and obesity, as well as a smoker, presents to the ED for evaluation of chest pain which is sharp and intermittent and left-sided since overnight.  No known inciting factor.  No shortness of breath or wheezing.  No coughing.  No sputum production.  No skin rash.  No fever.  No pleuritic chest pain.  Reports she thought this may be related to indigestion.  Pain was moderate and currently 5 out of 10.  He makes worse or better.     Past Medical History:  Diagnosis Date  . Abscess of buttock, left 12/24/15  . Asthma   . Hypertension     Patient Active Problem List   Diagnosis Date Noted  . Abscess of buttock, left 12/24/2015  . 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 History of cesarean section 01/09/2013  . BP (high blood pressure) 11/22/2012  . Maternal tobacco use 11/22/2012    Past Surgical History:  Procedure Laterality Date  . NO PAST SURGERIES      Prior to Admission medications   Medication Sig Start Date End Date Taking? Authorizing Provider  albuterol (PROVENTIL HFA;VENTOLIN HFA) 108 (90 Base) MCG/ACT inhaler Inhale 2 puffs into the lungs every 6 (six) hours as needed for wheezing or shortness of breath. 05/31/17   Enid Derry, PA-C  albuterol (PROVENTIL) (2.5 MG/3ML) 0.083% nebulizer solution Take 3 mLs (2.5 mg total) by nebulization every 6 (six) hours as needed for wheezing or shortness of breath. 05/31/17   Enid Derry, PA-C  amLODipine (NORVASC) 10 MG tablet Take 1 tablet by mouth daily. 12/18/15   [provider]  benzonatate (TESSALON PERLES) 100 MG capsule Take 1 capsule  (100 mg total) by mouth 3 (three) times daily as needed. 09/12/16   Renford Dills, NP  clindamycin (CLEOCIN) 300 MG capsule Take 1 capsule (300 mg total) by mouth 3 (three) times daily. 12/23/15   Hagler, Jami L, PA-C  fluconazole (DIFLUCAN) 150 MG tablet 1 tablet. 12/26/15   [provider]  hydrochlorothiazide (HYDRODIURIL) 25 MG tablet Take 1 tablet by mouth daily. 12/18/15   [provider]  HYDROcodone-acetaminophen (NORCO) 5-325 MG tablet Take 1 tablet by mouth every 6 (six) hours as needed for severe pain. 12/23/15   Hagler, Jami L, PA-C  losartan (COZAAR) 50 MG tablet Take 1 tablet by mouth daily. 12/18/15   [provider]  predniSONE (DELTASONE) 10 MG tablet Take 6 tablets on day 1, take 5 tablets on day 2, take 4 tablets on day 3, take 3 tablets on day 4, take 2 tablets on day 5, take 1 tablet on day 6 05/31/17   Enid Derry, PA-C    Allergies  Allergen Reactions  . Lisinopril     Angioedema     Family History  Problem Relation Age of Onset  . Hypertension Mother   . Diabetes Mother   . Hypertension Father   . Heart disease Father     Social History Social History   Tobacco Use  . Smoking status: Current Every Day Smoker    Packs/day: 1.00    Types: Cigarettes  . Smokeless tobacco: Never Used  Substance Use Topics  . Alcohol  use: Yes    Comment: 2 Beers Weekly  . Drug use: No    Review of Systems  Constitutional: Negative for fever. Eyes: Negative for visual changes. ENT: Negative for sore throat. Cardiovascular: Positive as per HPI for chest pain. Respiratory: Negative for shortness of breath. Gastrointestinal: Negative for abdominal pain, vomiting and diarrhea. Genitourinary: Negative for dysuria. Musculoskeletal: Negative for back pain. Skin: Negative for rash. Neurological: Negative for headache.  ____________________________________________   PHYSICAL EXAM:  VITAL SIGNS: ED Triage Vitals  Enc Vitals Group     BP  05/16/18 0842 (!) 154/94     Pulse Rate 05/16/18 0842 91     Resp 05/16/18 0842 18     Temp 05/16/18 0842 98.4 F (36.9 C)     Temp Source 05/16/18 0842 Oral     SpO2 05/16/18 0842 99 %     Weight 05/16/18 0843 255 lb (115.7 kg)     Height 05/16/18 0843 5\' 2"  (1.575 m)     Head Circumference --      Peak Flow --      Pain Score 05/16/18 0843 8     Pain Loc --      Pain Edu? --      Excl. in GC? --      Constitutional: Alert and oriented.  HEENT      Head: Normocephalic and atraumatic.      Eyes: Conjunctivae are normal. Pupils equal and round.       Ears:         Nose: No congestion/rhinnorhea.      Mouth/Throat: Mucous membranes are moist.      Neck: No stridor. Cardiovascular/Chest: Normal rate, regular rhythm.  No murmurs, rubs, or gallops. Respiratory: Normal respiratory effort without tachypnea nor retractions. Breath sounds are clear and equal bilaterally. No wheezes/rales/rhonchi. Gastrointestinal: Soft. No distention, no guarding, no rebound. Nontender.  Obese. Genitourinary/rectal:Deferred Musculoskeletal: Nontender with normal range of motion in all extremities. No joint effusions.  No lower extremity tenderness.  No edema. Neurologic:  Normal speech and language. No gross or focal neurologic deficits are appreciated. Skin:  Skin is warm, dry and intact. No rash noted. Psychiatric: Mood and affect are normal. Speech and behavior are normal. Patient exhibits appropriate insight and judgment.   ____________________________________________  LABS (pertinent positives/negatives) I, Governor Rooks, MD the attending physician have reviewed the labs noted below.  Labs Reviewed  BASIC METABOLIC PANEL - Abnormal; Notable for the following components:      Result Value   Sodium 134 (*)    Potassium 3.2 (*)    Glucose, Bld 100 (*)    All other components within normal limits  CBC  TROPONIN I  POC URINE PREG, ED  POCT PREGNANCY, URINE     ____________________________________________    EKG I, Governor Rooks, MD, the attending physician have personally viewed and interpreted all ECGs.  85 bpm.  Normal sinus rhythm.  Sinus arrhythmia.  Narrow QRS.  Normal axis.  Nonspecific T wave flattening laterally. ____________________________________________  RADIOLOGY   Chest x-ray two-view radiology report reviewed: No acute findings. __________________________________________  PROCEDURES  Procedure(s) performed: None  Procedures  Critical Care performed: None   ____________________________________________  ED COURSE / ASSESSMENT AND PLAN  Pertinent labs & imaging results that were available during my care of the patient were reviewed by me and considered in my medical decision making (see chart for details).    Patient is overall well-appearing with slight hypertension here.  No hypoxia, 99% on  room air.  No respiratory complaints.  No pleuritic chest pain.  PERC negative, not suspicious for PE.  We did discuss return precautions with expect to PE.  Symptoms do not seem cardiac, pain since last night, reassuring EKG and troponin with symptoms ongoing more than 4 hours I do not think a repeat is necessary.  No infectious symptoms.  No symptoms of shingles.  I am most suspicious of likely GI etiology/GERD and we discussed management of that.  We also discussed smoking cessation.    CONSULTATIONS: None   Patient / Family / Caregiver informed of clinical course, medical decision-making process, and agree with plan.   I discussed return precautions, follow-up instructions, and discharge instructions with patient and/or family.  Discharge Instructions : You are evaluated for chest discomfort, and although no certain cause was found, your exam and evaluation are overall reassuring in the emergency department today.  Return to the emergency department immediately for any breathing troubles, shortness of breath,  chest pain with breathing, new or uncontrolled chest pain, vomiting blood, skin rash, fever, or any other symptoms concerning to you.  We discussed strategies to help quit smoking including habit change as well as nicotine dependence.  For acid reflux/GERD, you may try over-the-counter Maalox and/or Tums, use as directed to help neutralize acid as needed for symptoms.  Please also try Prilosec 40 mg once daily for 10 to 14 days as your stomach heals.    ___________________________________________   FINAL CLINICAL IMPRESSION(S) / ED DIAGNOSES   Final diagnoses:  Nonspecific chest pain  Gastroesophageal reflux disease, esophagitis presence not specified      ___________________________________________         Note: This dictation was prepared with Dragon dictation. Any transcriptional errors that result from this process are unintentional    Governor Rooks, MD 05/16/18 1109

## 2019-08-24 ENCOUNTER — Ambulatory Visit: Payer: BLUE CROSS/BLUE SHIELD | Attending: Internal Medicine

## 2019-08-24 ENCOUNTER — Other Ambulatory Visit: Payer: Self-pay

## 2019-08-24 DIAGNOSIS — Z20822 Contact with and (suspected) exposure to covid-19: Secondary | ICD-10-CM

## 2019-08-26 LAB — NOVEL CORONAVIRUS, NAA: SARS-CoV-2, NAA: NOT DETECTED

## 2020-04-19 IMAGING — CR DG CHEST 2V
1 series · 2 of 2 positions shown · non-contrast
Comparison: None.

CLINICAL DATA: Stabbing left chest pain.

EXAM:
CHEST - 2 VIEW

[Series 1: dg chest 2 view · 0.14mm/px · 2 of 2 slices shown]
[im 1/2]
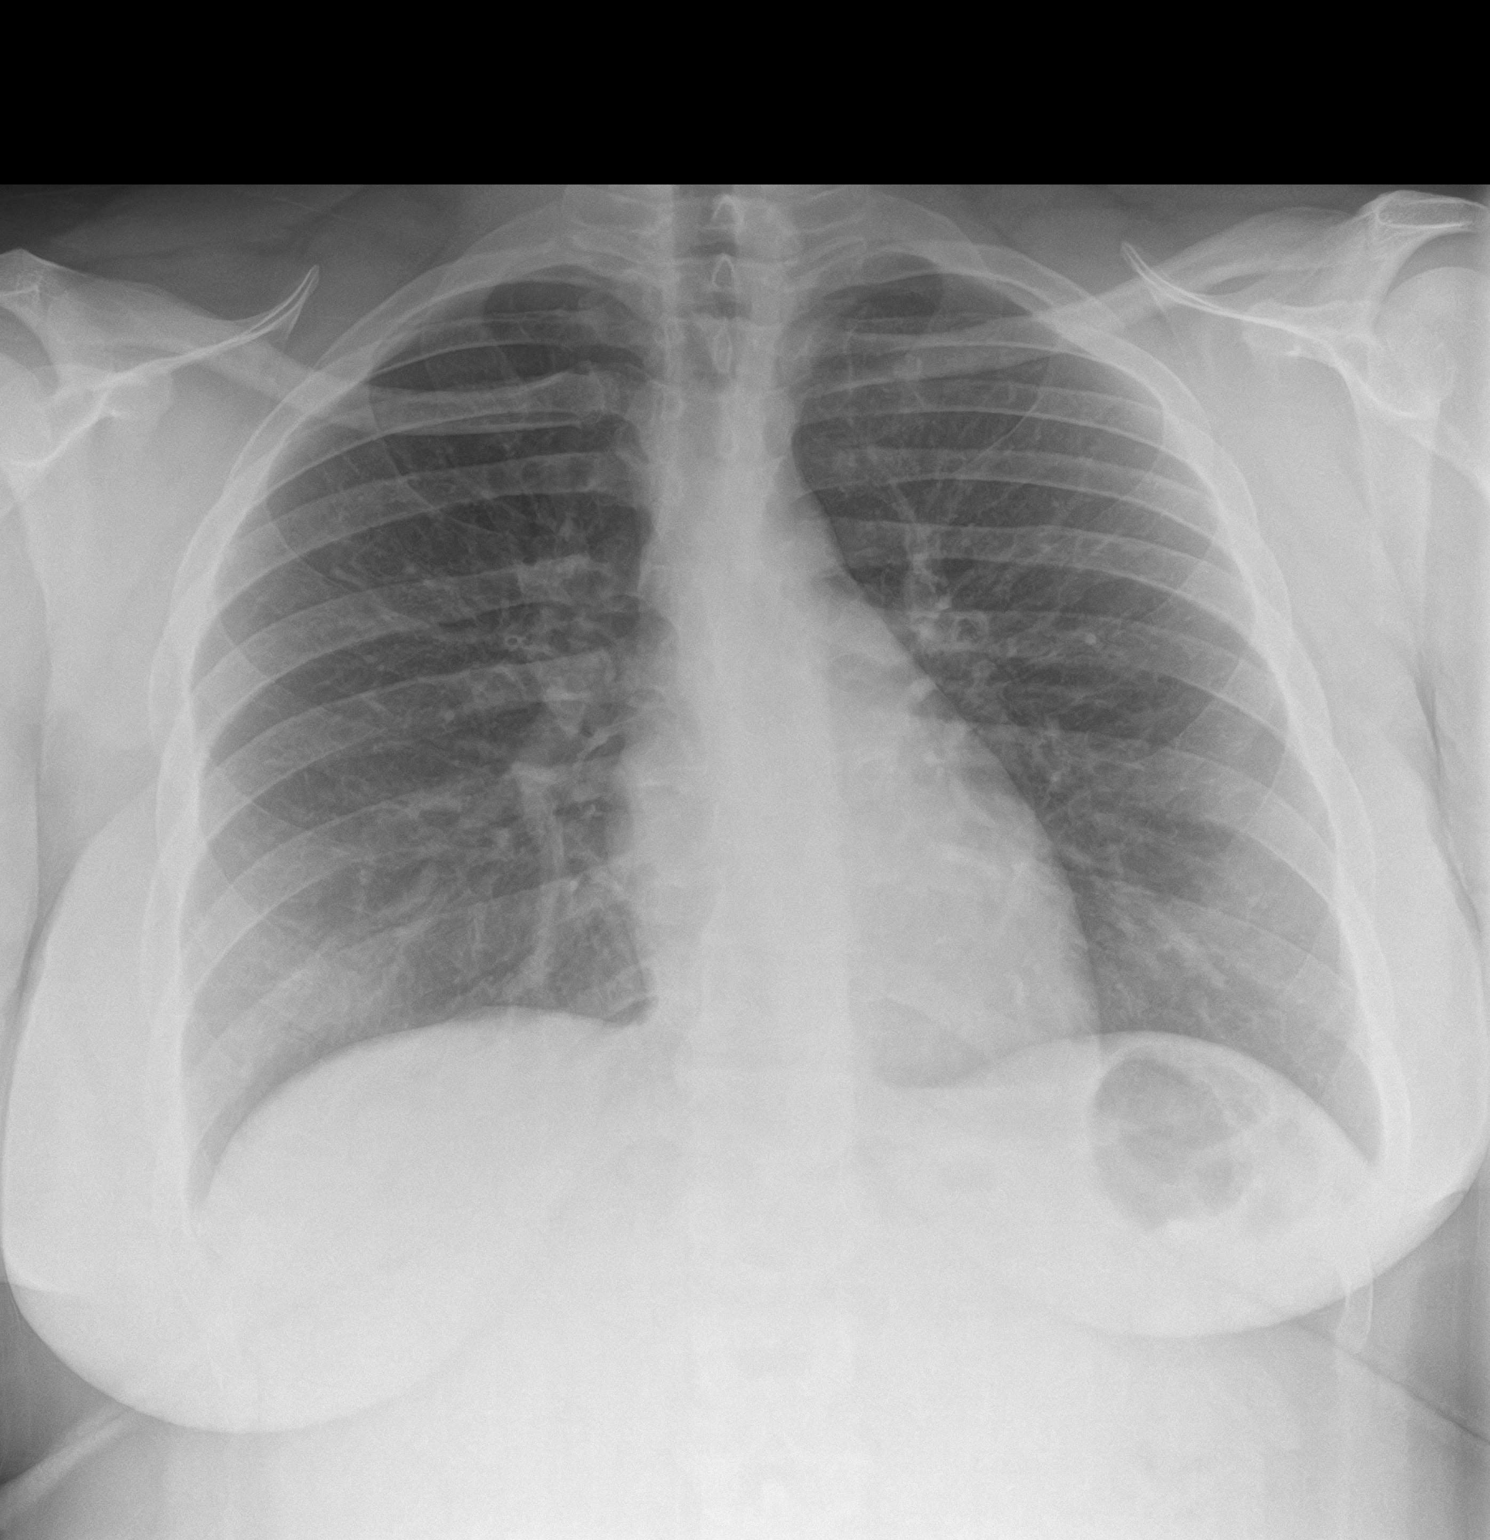
[im 2/2]
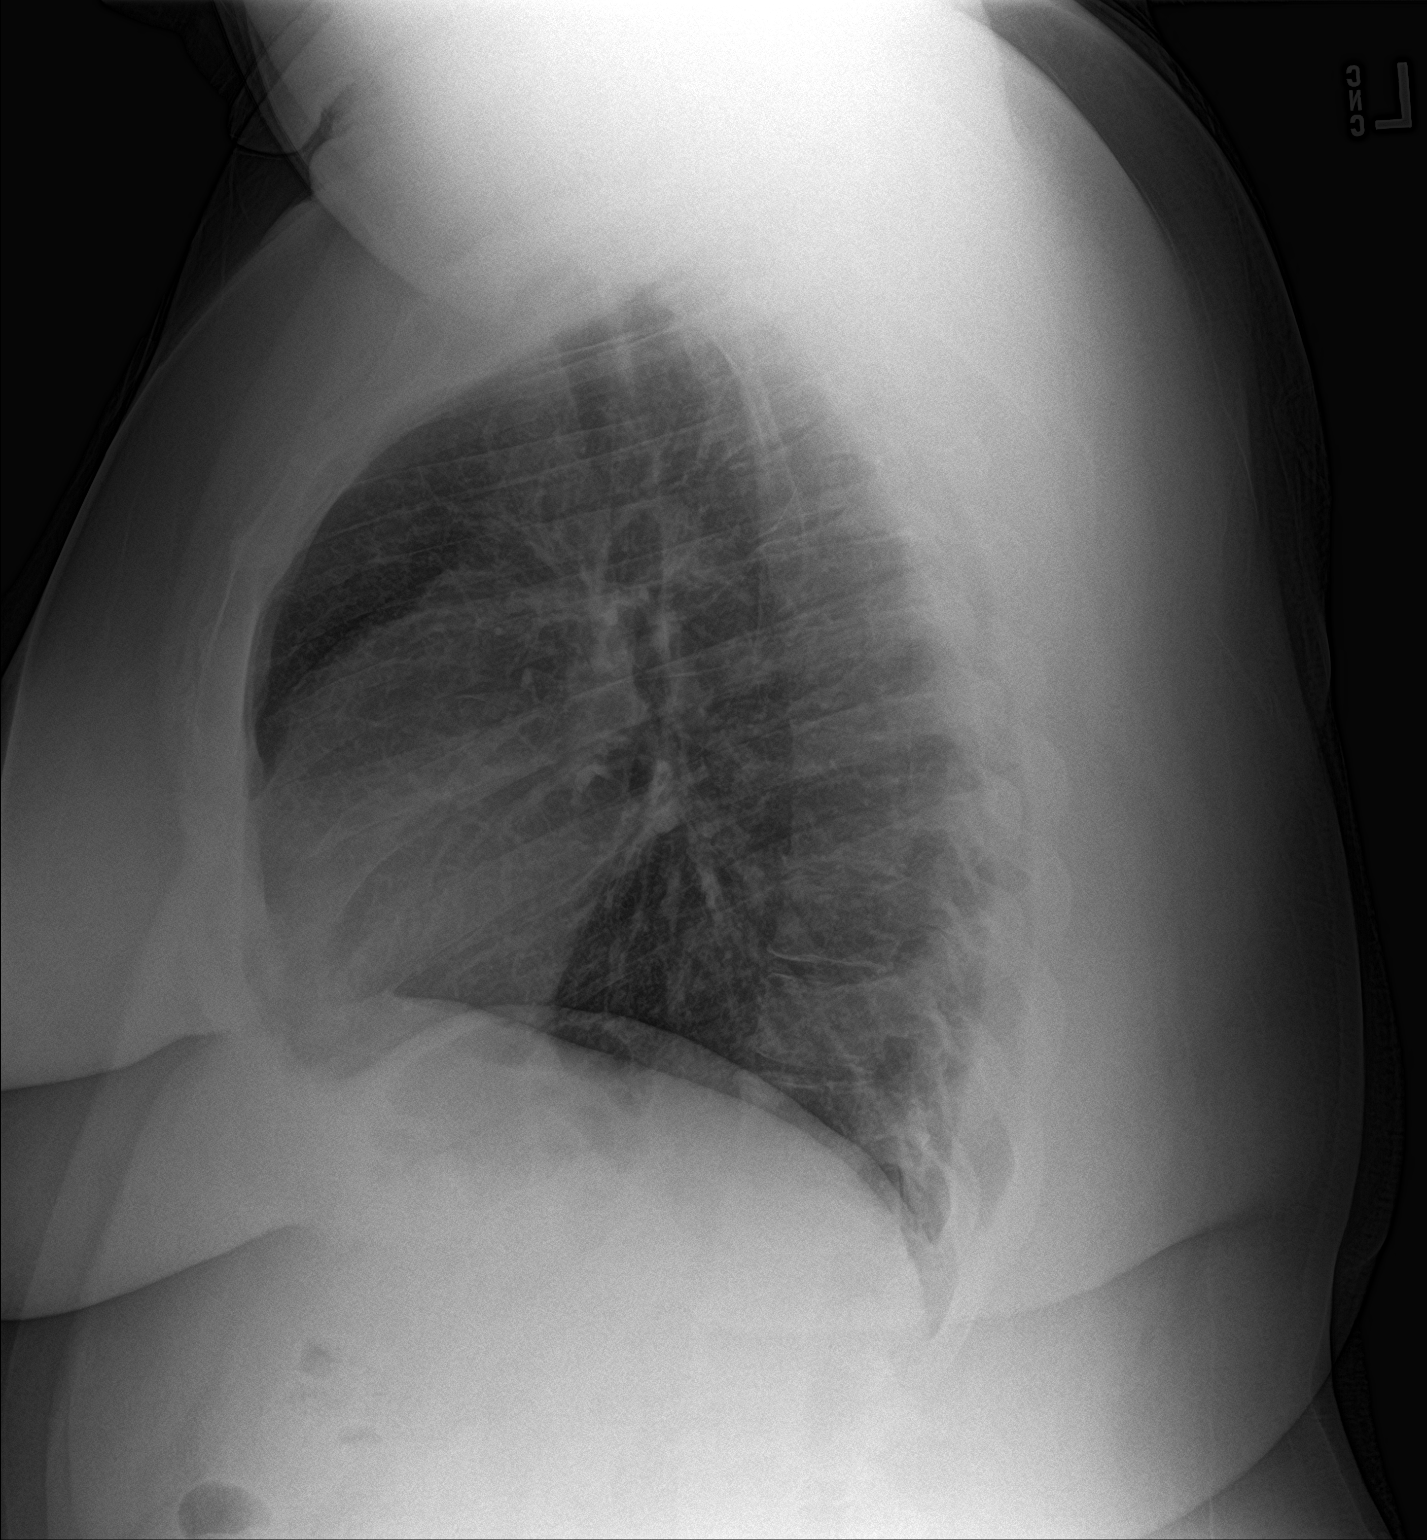

[2 of 2 positions shown; findings below may reference images not displayed]

FINDINGS: The heart size and mediastinal contours are within normal limits.
Both lungs are clear. The visualized skeletal structures are
unremarkable.
IMPRESSION: No active cardiopulmonary disease.

## 2021-09-17 ENCOUNTER — Other Ambulatory Visit (HOSPITAL_COMMUNITY): Payer: Self-pay

## 2023-04-13 ENCOUNTER — Emergency Department
Admission: EM | Admit: 2023-04-13 | Discharge: 2023-04-13 | Disposition: A | Discharge status: Home / Self Care | Payer: Medicaid Other | Source: Home / Self Care | Attending: Emergency Medicine | Admitting: Emergency Medicine

## 2023-04-13 ENCOUNTER — Other Ambulatory Visit: Payer: Self-pay

## 2023-04-13 ENCOUNTER — Encounter: Payer: Self-pay | Admitting: Emergency Medicine

## 2023-04-13 DIAGNOSIS — I10 Essential (primary) hypertension: Secondary | ICD-10-CM | POA: Insufficient documentation

## 2023-04-13 DIAGNOSIS — M5441 Lumbago with sciatica, right side: Secondary | ICD-10-CM | POA: Diagnosis not present

## 2023-04-13 DIAGNOSIS — M545 Low back pain, unspecified: Secondary | ICD-10-CM | POA: Diagnosis present

## 2023-04-13 DIAGNOSIS — J45909 Unspecified asthma, uncomplicated: Secondary | ICD-10-CM | POA: Diagnosis not present

## 2023-04-13 LAB — URINALYSIS, ROUTINE W REFLEX MICROSCOPIC
Bilirubin Urine: NEGATIVE
Glucose, UA: 50 mg/dL — AB
Ketones, ur: NEGATIVE mg/dL
Leukocytes,Ua: NEGATIVE
Nitrite: NEGATIVE
Protein, ur: NEGATIVE mg/dL
Specific Gravity, Urine: 1.021 (ref 1.005–1.030)
pH: 6 (ref 5.0–8.0)

## 2023-04-13 LAB — POC URINE PREG, ED: Preg Test, Ur: NEGATIVE

## 2023-04-13 MED ORDER — METHOCARBAMOL 500 MG PO TABS
1000.0000 mg | ORAL_TABLET | Freq: Once | ORAL | Status: AC
  Administered 2023-04-13: 1000 mg via ORAL
  Filled 2023-04-13: qty 2

## 2023-04-13 MED ORDER — KETOROLAC TROMETHAMINE 30 MG/ML IJ SOLN
30.0000 mg | Freq: Once | INTRAMUSCULAR | Status: AC
  Administered 2023-04-13: 30 mg via INTRAMUSCULAR
  Filled 2023-04-13: qty 1

## 2023-04-13 MED ORDER — HYDROCODONE-ACETAMINOPHEN 5-325 MG PO TABS
1.0000 | ORAL_TABLET | Freq: Once | ORAL | Status: AC
  Administered 2023-04-13: 1 via ORAL
  Filled 2023-04-13: qty 1

## 2023-04-13 MED ORDER — METHOCARBAMOL 500 MG PO TABS: 500.0000 mg | ORAL_TABLET | Freq: Four times a day (QID) | ORAL | 0 refills | Status: AC | PRN

## 2023-04-13 MED ORDER — HYDROCODONE-ACETAMINOPHEN 5-325 MG PO TABS
1.0000 | ORAL_TABLET | Freq: Four times a day (QID) | ORAL | 0 refills | Status: AC | PRN
Start: 2023-04-13 — End: 2024-04-12

## 2023-04-13 MED ORDER — PREDNISONE 10 MG PO TABS: ORAL_TABLET | ORAL | 0 refills | Status: AC

## 2023-04-13 NOTE — ED Triage Notes (Signed)
Pt comes with c/o right sided lower back pain tht shoots down her leg. Pt states this all started two weeks ago. Pt states no known injuries. Pt states little pain when peeing.

## 2023-04-13 NOTE — Discharge Instructions (Signed)
Follow-up with your primary care provider if any continued problems.  Prescription for hydrocodone, methocarbamol and prednisone was sent to the pharmacy for you to begin taking.  Be aware that she cannot drive or operate machinery while taking the methocarbamol and hydrocodone as it could cause drowsiness and increased risk for injury.  You may use ice or heat to your back as needed for discomfort.

## 2023-04-13 NOTE — ED Notes (Signed)
See triage note  Presents with lower back pain  states pain is moving into right leg  Denies any injury  Has seen ortho and is to be scheduled for MRI  States pain is getting worse   Ambulates well

## 2023-04-13 NOTE — ED Provider Notes (Signed)
[facility] Provider Note    Event Date/Time   First MD Initiated Contact with Patient 04/13/23 [phone]     (approximate)   History   Back Pain   HPI  Julie Reid is a 32 y.o. female   presents to the ED with complaint of low back pain with radiation into her right lower extremity.  Patient states that she has had problems with her back in the past has been seen for this.  She states that approximately 2 weeks ago she began having radiation into her right lower extremity.  No recent injury.  Patient states that 2 weeks ago she was seen in the ER in [place] which time an ultrasound was done and determined that she did not have a DVT.  No over-the-counter medication has been taken.  Patient denies any incontinence of bowel or bladder and continues to ambulate without any assistance.  She does report some minimal dysuria but denies vaginal discharge.  Patient has a history of hypertension, asthma.      Physical Exam   Triage Vital Signs: ED Triage Vitals  Encounter Vitals Group     BP 04/13/23 0821 (!) 177/105     Systolic BP Percentile --      Diastolic BP Percentile --      Pulse Rate 04/13/23 0821 80     Resp 04/13/23 0821 18     Temp 04/13/23 0821 98 F (36.7 C)     Temp src --      SpO2 04/13/23 0821 100 %     Weight 04/13/23 0836 255 lb 1.2 oz (115.7 kg)     Height 04/13/23 0836 5\' 2"  (1.575 m)     Head Circumference --      Peak Flow --      Pain Score 04/13/23 0820 10     Pain Loc --      Pain Education --      Exclude from Growth Chart --     Most recent vital signs: Vitals:   04/13/23 0821  BP: (!) 177/105  Pulse: 80  Resp: 18  Temp: 98 F (36.7 C)  SpO2: 100%     General: Awake, no distress.  CV:  Good peripheral perfusion.  Resp:  Normal effort.  Abd:  No distention.  Soft, nontender, bowel sounds x 4 quadrants within normal limits. Other:  No point tenderness on palpation of the lumbar spine midline.  Increased  tenderness on palpation of the SI joint area and surrounding tissue.  Straight leg raises on the right for approximately 40 degrees.  Good muscle strength at 5/5.  No soft tissue edema or pitting edema present lower extremity.  Patient is able to stand and ambulate without any assistance.   ED Results / Procedures / Treatments   Labs (all labs ordered are listed, but only abnormal results are displayed) Labs Reviewed  URINALYSIS, ROUTINE W REFLEX MICROSCOPIC - Abnormal; Notable for the following components:      Result Value   Color, Urine YELLOW (*)    APPearance HAZY (*)    Glucose, UA 50 (*)    Hgb urine dipstick SMALL (*)    Bacteria, UA RARE (*)    All other components within normal limits  POC URINE PREG, ED       PROCEDURES:  Critical Care performed:   Procedures   MEDICATIONS ORDERED IN ED: Medications  ketorolac (TORADOL) 30 MG/ML injection 30 mg (30 mg Intramuscular Given 04/13/23  1100)  methocarbamol (ROBAXIN) tablet 1,000 mg (1,000 mg Oral Given 04/13/23 1100)  HYDROcodone-acetaminophen (NORCO/VICODIN) 5-325 MG per tablet 1 tablet (1 tablet Oral Given 04/13/23 1100)     IMPRESSION / MDM / ASSESSMENT AND PLAN / ED COURSE  I reviewed the triage vital signs and the nursing notes.   Differential diagnosis includes, but is not limited to, lumbar strain, lumbar pain, radiculopathy, sciatica right lower extremity, UTI.  32 year old female presents to the ED with complaint of low back pain with radiation into her right lower extremity.  DVT to the right lower extremity was ruled out at a prior ED visit in [place].  Patient was given Toradol 30 mg IM, methocarbamol and hydrocodone and began getting some relief.  Urinalysis was negative for UTI.  Patient was made aware that a prescription for prednisone, methocarbamol and hydrocodone was being sent to the pharmacy.  She has to follow-up with her PCP if any continued problems.  She is also encouraged to use ice or heat to her  back as needed for discomfort.  Patient was advised not to drive or operate machinery while taking the methocarbamol and hydrocodone.      Patient's presentation is most consistent with acute complicated illness / injury requiring diagnostic workup.  FINAL CLINICAL IMPRESSION(S) / ED DIAGNOSES   Final diagnoses:  Acute midline low back pain with right-sided sciatica     Rx / DC Orders   ED Discharge Orders          Ordered    predniSONE (DELTASONE) 10 MG tablet        04/13/23 1112    methocarbamol (ROBAXIN) 500 MG tablet  Every 6 hours PRN        04/13/23 1112    HYDROcodone-acetaminophen (NORCO/VICODIN) 5-325 MG tablet  Every 6 hours PRN        04/13/23 1112             Note:  This document was prepared using Dragon voice recognition software and may include unintentional dictation errors.   Tommi Rumps, PA-C 04/13/23 1250    Phineas Semen, MD 04/13/23 [phone]

## 2023-08-29 ENCOUNTER — Emergency Department: Payer: No Typology Code available for payment source

## 2023-08-29 ENCOUNTER — Other Ambulatory Visit: Payer: Self-pay

## 2023-08-29 ENCOUNTER — Emergency Department
Admission: EM | Admit: 2023-08-29 | Discharge: 2023-08-29 | Disposition: A | Discharge status: Home / Self Care | Payer: No Typology Code available for payment source | Attending: Emergency Medicine | Admitting: Emergency Medicine

## 2023-08-29 ENCOUNTER — Encounter: Payer: Self-pay | Admitting: Emergency Medicine

## 2023-08-29 DIAGNOSIS — S82831A Other fracture of upper and lower end of right fibula, initial encounter for closed fracture: Secondary | ICD-10-CM | POA: Insufficient documentation

## 2023-08-29 DIAGNOSIS — M25561 Pain in right knee: Secondary | ICD-10-CM | POA: Diagnosis not present

## 2023-08-29 DIAGNOSIS — I10 Essential (primary) hypertension: Secondary | ICD-10-CM | POA: Diagnosis not present

## 2023-08-29 DIAGNOSIS — S8991XA Unspecified injury of right lower leg, initial encounter: Secondary | ICD-10-CM | POA: Diagnosis present

## 2023-08-29 DIAGNOSIS — S82839A Other fracture of upper and lower end of unspecified fibula, initial encounter for closed fracture: Secondary | ICD-10-CM

## 2023-08-29 DIAGNOSIS — W010XXA Fall on same level from slipping, tripping and stumbling without subsequent striking against object, initial encounter: Secondary | ICD-10-CM | POA: Diagnosis not present

## 2023-08-29 MED ORDER — IBUPROFEN 800 MG PO TABS
800.0000 mg | ORAL_TABLET | ORAL | Status: AC
  Administered 2023-08-29: 800 mg via ORAL
  Filled 2023-08-29: qty 1

## 2023-08-29 NOTE — Discharge Instructions (Signed)
Please use the knee immobilizer we provided as well as crutches when out of bed or bearing weight.  Follow-up close with orthopedics  Return to the ER right away if you develop a fever, severe pain, weakness in the leg, cold or blue foot, fever, severe swelling or other concerns arise.

## 2023-08-29 NOTE — ED Provider Notes (Signed)
[facility] Provider Note    Event Date/Time   First MD Initiated Contact with Patient 08/29/23 [phone]     (approximate)   History   Fall   HPI  Julie Reid is a 32 y.o. female reports history of hypertension  Today she was in her home, she reports she sort of tripped causing her to fall.  Caused her right knee to bend awkwardly.  She was able to get up, but it has been sore mostly over the outer surface of the lower part of the knee.  She felt like maybe her kneecap became loose, but is now back in place.  She felt a little tingly in her lower leg but that is now back to normal  She reports moderate pain with motion through the right knee.  Able to wiggle feel the toes normally.  No cold or blue foot  Has not take any medication for discomfort yet.  Denies pregnancy       Physical Exam   Triage Vital Signs: ED Triage Vitals  Encounter Vitals Group     BP 08/29/23 0933 (!) 152/98     Systolic BP Percentile --      Diastolic BP Percentile --      Pulse Rate 08/29/23 0933 99     Resp 08/29/23 0933 18     Temp 08/29/23 0933 99.3 F (37.4 C)     Temp Source 08/29/23 0933 Oral     SpO2 08/29/23 0933 97 %     Weight 08/29/23 0932 270 lb (122.5 kg)     Height 08/29/23 0932 5\' 2"  (1.575 m)     Head Circumference --      Peak Flow --      Pain Score 08/29/23 0932 10     Pain Loc --      Pain Education --      Exclude from Growth Chart --     Most recent vital signs: Vitals:   08/29/23 0933  BP: (!) 152/98  Pulse: 99  Resp: 18  Temp: 99.3 F (37.4 C)  SpO2: 97%     General: Awake, no distress.  CV:  Good peripheral perfusion.  Strong palpable pulse of the right foot.  Warm well-perfused Resp:  Normal effort.  Abd:  No distention.  Other:  Right lower extremity able to demonstrate good range of motion of plantar dorsiflexion toe wiggle, also able to bend and flex extend hip and knee but reports tenderness or achiness through range  of motion of the knee joint.  There is no obvious deformity warm well-perfused.  Normal sensation distally.  Some point tenderness over the proximal tib-fib anteriorly more along the lateral/tibial plateau region.  No obvious deformity or joint arthrosis.   ED Results / Procedures / Treatments   Labs (all labs ordered are listed, but only abnormal results are displayed) Labs Reviewed - No data to display   EKG     RADIOLOGY  X-rays interpreted by me is concerning for possible avulsion fracture of the proximal fibula  DG Tibia/Fibula Right Result Date: 08/29/2023 CLINICAL DATA:  Larey Seat.  Right leg pain. EXAM: RIGHT TIBIA AND FIBULA - 2 VIEW COMPARISON:  None Available. FINDINGS: The knee and ankle joints are maintained. Avulsion type fracture involving the fibular head could be remote or acute. Recommend correlation with pain and tenderness in this area. No tibia or fibular shaft fractures. IMPRESSION: 1. Avulsion type fracture involving the fibular head could be remote  or acute. Recommend correlation with pain and tenderness in this area. 2. No tibia or fibular shaft fractures. Electronically Signed   By: Rudie Meyer M.D.   On: 08/29/2023 10:14   DG Knee 2 Views Right Result Date: 08/29/2023 CLINICAL DATA:  Larey Seat.  Injured right knee. EXAM: RIGHT KNEE - 1-2 VIEW COMPARISON:  None Available. FINDINGS: The joint spaces are maintained. There is an avulsion fracture involving the fibular head. Margins appear somewhat smooth and corticated. This could be an old fracture. Recommend correlation with physical examination and point tenderness over this area. The femur and tibia are intact. There is a suspected suprapatellar knee joint effusion. IMPRESSION: 1. Avulsion fracture involving the fibular head. This could be an old fracture. Recommend correlation with physical examination and point tenderness over this area. 2. Suspected suprapatellar knee joint effusion. Electronically Signed   By: Rudie Meyer M.D.   On: 08/29/2023 10:12      PROCEDURES:  Critical Care performed: No  Procedures   MEDICATIONS ORDERED IN ED: Medications  ibuprofen (ADVIL) tablet 800 mg (800 mg Oral Given 08/29/23 0938)     IMPRESSION / MDM / ASSESSMENT AND PLAN / ED COURSE  I reviewed the triage vital signs and the nursing notes.                              Differential diagnosis includes, but is not limited to, most likely musculoskeletal injury possible internal derangement of the knee though less likely given the no effusion and reassuring examination.  There is no evidence of dislocation.  Her neurovascular exam very reassuring.  If x-rays are negative would advise conservative measures, careful return precautions  Patient's presentation is most consistent with acute complicated illness / injury requiring diagnostic workup.   ----------------------------------------- 11:22 AM on 08/29/2023 ----------------------------------------- Patient resting comfortably pain well-controlled.  Comfortable with plan for discharge and reviewed use of knee immobilizer, crutches, and follow-up with orthopedics.  Discussed return precautions which patient is agreeable with.  Return precautions and treatment recommendations and follow-up discussed with the patient who is agreeable with the plan.        FINAL CLINICAL IMPRESSION(S) / ED DIAGNOSES   Final diagnoses:  Avulsion of head of fibula  RIGHT   Rx / DC Orders   ED Discharge Orders     None        Note:  This document was prepared using Dragon voice recognition software and may include unintentional dictation errors.   Sharyn Creamer, MD 08/29/23 1122

## 2023-08-29 NOTE — ED Triage Notes (Signed)
Patient to ED via POV after a fall. States she slipped on something wet. C/o right knee pain. Denies hitting head.

## 2023-08-29 NOTE — ED Provider Notes (Signed)
.     Julie Creamer, MD 08/29/23 1210

## 2023-10-05 ENCOUNTER — Other Ambulatory Visit: Payer: Self-pay | Admitting: Orthopedic Surgery

## 2023-10-05 DIAGNOSIS — M25461 Effusion, right knee: Secondary | ICD-10-CM

## 2023-10-05 DIAGNOSIS — M25361 Other instability, right knee: Secondary | ICD-10-CM

## 2023-10-05 DIAGNOSIS — M1711 Unilateral primary osteoarthritis, right knee: Secondary | ICD-10-CM

## 2023-10-05 DIAGNOSIS — S82839A Other fracture of upper and lower end of unspecified fibula, initial encounter for closed fracture: Secondary | ICD-10-CM

## 2023-10-11 ENCOUNTER — Encounter: Payer: Self-pay | Admitting: Orthopedic Surgery

## 2023-10-16 ENCOUNTER — Other Ambulatory Visit: Payer: BLUE CROSS/BLUE SHIELD

## 2023-11-06 ENCOUNTER — Other Ambulatory Visit: Payer: BLUE CROSS/BLUE SHIELD
